# Patient Record
Sex: Male | Born: 1990 | Race: Black or African American | Hispanic: No | Marital: Single | Smoking: Current every day smoker
Health system: Southern US, Community
[De-identification: ages and names within clinical notes are randomized; demographics above are authoritative.]

---

## 2012-12-15 ENCOUNTER — Emergency Department (HOSPITAL_BASED_OUTPATIENT_CLINIC_OR_DEPARTMENT_OTHER)
Admission: EM | Admit: 2012-12-15 | Discharge: 2012-12-15 | Disposition: A | Discharge status: Home / Self Care | Payer: Self-pay | Attending: Emergency Medicine | Admitting: Emergency Medicine

## 2012-12-15 ENCOUNTER — Encounter (HOSPITAL_BASED_OUTPATIENT_CLINIC_OR_DEPARTMENT_OTHER): Payer: Self-pay | Admitting: *Deleted

## 2012-12-15 DIAGNOSIS — Y939 Activity, unspecified: Secondary | ICD-10-CM | POA: Insufficient documentation

## 2012-12-15 DIAGNOSIS — Z888 Allergy status to other drugs, medicaments and biological substances status: Secondary | ICD-10-CM | POA: Insufficient documentation

## 2012-12-15 DIAGNOSIS — IMO0002 Reserved for concepts with insufficient information to code with codable children: Secondary | ICD-10-CM | POA: Insufficient documentation

## 2012-12-15 DIAGNOSIS — S91209A Unspecified open wound of unspecified toe(s) with damage to nail, initial encounter: Secondary | ICD-10-CM

## 2012-12-15 DIAGNOSIS — F17200 Nicotine dependence, unspecified, uncomplicated: Secondary | ICD-10-CM | POA: Insufficient documentation

## 2012-12-15 DIAGNOSIS — Y990 Civilian activity done for income or pay: Secondary | ICD-10-CM | POA: Insufficient documentation

## 2012-12-15 DIAGNOSIS — Y9269 Other specified industrial and construction area as the place of occurrence of the external cause: Secondary | ICD-10-CM | POA: Insufficient documentation

## 2012-12-15 DIAGNOSIS — S91109A Unspecified open wound of unspecified toe(s) without damage to nail, initial encounter: Secondary | ICD-10-CM | POA: Insufficient documentation

## 2012-12-15 MED ORDER — IBUPROFEN 800 MG PO TABS
800.0000 mg | ORAL_TABLET | Freq: Once | ORAL | Status: AC
  Administered 2012-12-15: 800 mg via ORAL
  Filled 2012-12-15: qty 1

## 2012-12-15 NOTE — ED Notes (Signed)
MD at bedside. 

## 2012-12-15 NOTE — ED Provider Notes (Signed)
History     CSN: [id number]  Arrival date & time 12/15/12  1027   First MD Initiated Contact with Patient 12/15/12 0259      Chief Complaint  Patient presents with  . Toe Injury    (Consider location/radiation/quality/duration/timing/severity/associated sxs/prior treatment) HPI 21 year old male presents emergency department complaining of left toe injury. Patient reports he was at work when he struck his left toe. Patient has had bleeding and pain at the area of his toenails. He has no pain to his foot. He has no other injuries. He has no other complaints.  History reviewed. No pertinent past medical history.  History reviewed. No pertinent past surgical history.  History reviewed. No pertinent family history.  History  Substance Use Topics  . Smoking status: Current Every Day Smoker  . Smokeless tobacco: Not on file  . Alcohol Use: Yes      Review of Systems  All other systems reviewed and are negative.   other than listed in history of present illness  Allergies  Benadryl  Home Medications  No current outpatient prescriptions on file.  BP 129/72  Pulse 84  Temp 98.4 F (36.9 C) (Oral)  Resp 16  SpO2 99%  Physical Exam  Constitutional: He appears well-developed and well-nourished. No distress.  Musculoskeletal:       Patient with dried blood at the medial aspect at distal tip of great toe. Great toenail is in place, but is loose on the nail bed. Ecchymosis noted at base of toenail. No active bleeding. There is no deformity to the toe, no effusion no crepitus with palpation  Skin: Skin is warm and dry.    ED Course  Procedures (including critical care time)  Labs Reviewed - No data to display No results found.   1. Avulsion of toenail       MDM  21 year old male with partial avulsion of his left great toenail. Will leave the toenail in place to aid with growth of new nail. We'll place in a postop shoe for protection. Dressing will be  applied        Olivia Mackie, MD 12/15/12 [phone]

## 2012-12-15 NOTE — ED Notes (Signed)
Pt c/o pain to right great toe nail after injuring it at work

## 2013-11-24 ENCOUNTER — Encounter (HOSPITAL_BASED_OUTPATIENT_CLINIC_OR_DEPARTMENT_OTHER): Payer: Self-pay | Admitting: Emergency Medicine

## 2013-11-24 ENCOUNTER — Emergency Department (HOSPITAL_BASED_OUTPATIENT_CLINIC_OR_DEPARTMENT_OTHER)
Admission: EM | Admit: 2013-11-24 | Discharge: 2013-11-24 | Disposition: A | Discharge status: Home / Self Care | Payer: Self-pay | Attending: Emergency Medicine | Admitting: Emergency Medicine

## 2013-11-24 DIAGNOSIS — X500XXA Overexertion from strenuous movement or load, initial encounter: Secondary | ICD-10-CM | POA: Insufficient documentation

## 2013-11-24 DIAGNOSIS — S6980XA Other specified injuries of unspecified wrist, hand and finger(s), initial encounter: Secondary | ICD-10-CM | POA: Insufficient documentation

## 2013-11-24 DIAGNOSIS — S6990XA Unspecified injury of unspecified wrist, hand and finger(s), initial encounter: Secondary | ICD-10-CM | POA: Insufficient documentation

## 2013-11-24 DIAGNOSIS — Y9289 Other specified places as the place of occurrence of the external cause: Secondary | ICD-10-CM | POA: Insufficient documentation

## 2013-11-24 DIAGNOSIS — M65839 Other synovitis and tenosynovitis, unspecified forearm: Secondary | ICD-10-CM | POA: Insufficient documentation

## 2013-11-24 DIAGNOSIS — Y9389 Activity, other specified: Secondary | ICD-10-CM | POA: Insufficient documentation

## 2013-11-24 DIAGNOSIS — M779 Enthesopathy, unspecified: Secondary | ICD-10-CM

## 2013-11-24 DIAGNOSIS — F17200 Nicotine dependence, unspecified, uncomplicated: Secondary | ICD-10-CM | POA: Insufficient documentation

## 2013-11-24 DIAGNOSIS — Y990 Civilian activity done for income or pay: Secondary | ICD-10-CM | POA: Insufficient documentation

## 2013-11-24 NOTE — ED Provider Notes (Signed)
CSN: [id number]     Arrival date & time 11/24/13  2203 History  This chart was scribed for Samuel B. Bernette Mayers, MD by Ardelia Mems, ED Scribe. This patient was seen in room MH09/MH09 and the patient's care was started at 10:38 PM.   Chief Complaint  Patient presents with  . Finger Injury    The history is provided by the patient. No language interpreter was used.   HPI Comments: Samuel Stark is a 22 y.o. male who presents to the Emergency Department complaining of a right middle finger injury that occurred while lifting tools at work yesterday. He states that he lifts small tools at work repetitively, and he denies any specific injury or trauma. He is complaining of constant, moderate right middle finger pain onset after the injury. He reports associated swelling over the knuckle of his right middle finger. He denies any prior history of pain or injury to the finger. He denies fever or any other pain or symptoms.   History reviewed. No pertinent past medical history. History reviewed. No pertinent past surgical history. History reviewed. No pertinent family history.  History  Substance Use Topics  . Smoking status: Current Every Day Smoker -- 0.50 packs/day  . Smokeless tobacco: Not on file  . Alcohol Use: Yes    Review of Systems  A complete 10 system review of systems was obtained and all systems are negative except as noted in the HPI and PMH.   Allergies  Benadryl  Home Medications  No current outpatient prescriptions on file.  Triage Vitals: BP 124/81  Pulse 69  Temp(Src) 98.2 F (36.8 C) (Oral)  Resp 16  Ht 5\' 11"  (1.803 m)  Wt 163 lb (73.936 kg)  BMI 22.74 kg/m2  SpO2 100%  Physical Exam  Constitutional: He is oriented to person, place, and time. He appears well-developed and well-nourished.  HENT:  Head: Normocephalic and atraumatic.  Neck: Neck supple.  Pulmonary/Chest: Effort normal.  Musculoskeletal:  Mild swelling to the left third PIP joint. No  signs of tenosynovitis.  Neurological: He is alert and oriented to person, place, and time. No cranial nerve deficit.  Psychiatric: He has a normal mood and affect. His behavior is normal.    ED Course  Procedures (including critical care time)  DIAGNOSTIC STUDIES: Oxygen Saturation is 100% on RA, normal by my interpretation.    COORDINATION OF CARE: 10:41 PM- Pt advised of plan for treatment and pt agrees.  Labs Review Labs Reviewed - No data to display Imaging Review No results found.  EKG Interpretation   None       MDM   1. Tendonitis     Finger pain from overuse at work. No trauma. No concern for fracture. Likely a mild tendonitis. Recommend rest, ice, Hand follow up if not improving.    I personally performed the services described in this documentation, which was scribed in my presence. The recorded information has been reviewed and is accurate.      Samuel B. Bernette Mayers, MD 11/24/13 2325

## 2013-11-24 NOTE — ED Notes (Signed)
Pt c/o right middle finger injury

## 2013-11-24 NOTE — ED Notes (Signed)
C/o left hand pain onset yesterday  States has repetitive motion w that had,  Denies inj

## 2013-12-01 ENCOUNTER — Emergency Department (HOSPITAL_BASED_OUTPATIENT_CLINIC_OR_DEPARTMENT_OTHER): Payer: Self-pay

## 2013-12-01 ENCOUNTER — Emergency Department (HOSPITAL_BASED_OUTPATIENT_CLINIC_OR_DEPARTMENT_OTHER)
Admission: EM | Admit: 2013-12-01 | Discharge: 2013-12-01 | Disposition: A | Discharge status: Home / Self Care | Payer: Self-pay | Attending: Emergency Medicine | Admitting: Emergency Medicine

## 2013-12-01 ENCOUNTER — Encounter (HOSPITAL_BASED_OUTPATIENT_CLINIC_OR_DEPARTMENT_OTHER): Payer: Self-pay | Admitting: Emergency Medicine

## 2013-12-01 DIAGNOSIS — R1110 Vomiting, unspecified: Secondary | ICD-10-CM

## 2013-12-01 DIAGNOSIS — R112 Nausea with vomiting, unspecified: Secondary | ICD-10-CM | POA: Insufficient documentation

## 2013-12-01 DIAGNOSIS — R1030 Lower abdominal pain, unspecified: Secondary | ICD-10-CM | POA: Diagnosis present

## 2013-12-01 DIAGNOSIS — R109 Unspecified abdominal pain: Secondary | ICD-10-CM | POA: Insufficient documentation

## 2013-12-01 DIAGNOSIS — R197 Diarrhea, unspecified: Secondary | ICD-10-CM | POA: Insufficient documentation

## 2013-12-01 LAB — COMPREHENSIVE METABOLIC PANEL
ALT: 14 U/L (ref 0–53)
AST: 21 U/L (ref 0–37)
Albumin: 4 g/dL (ref 3.5–5.2)
CO2: 26 mEq/L (ref 19–32)
Calcium: 9.8 mg/dL (ref 8.4–10.5)
Creatinine, Ser: 1.2 mg/dL (ref 0.50–1.35)
GFR calc non Af Amer: 83 mL/min — ABNORMAL LOW (ref >90)
Sodium: 134 mEq/L — ABNORMAL LOW (ref 135–145)

## 2013-12-01 LAB — URINALYSIS W MICROSCOPIC + REFLEX CULTURE
Glucose, UA: NEGATIVE mg/dL
Hgb urine dipstick: NEGATIVE
Leukocytes, UA: NEGATIVE
Specific Gravity, Urine: 1.037 — ABNORMAL HIGH (ref 1.005–1.030)
pH: 6 (ref 5.0–8.0)

## 2013-12-01 LAB — CBC WITH DIFFERENTIAL/PLATELET
Basophils Absolute: 0 10*3/uL (ref 0.0–0.1)
Basophils Relative: 0 % (ref 0–1)
Eosinophils Relative: 0 % (ref 0–5)
Lymphocytes Relative: 13 % (ref 12–46)
MCHC: 34.8 g/dL (ref 30.0–36.0)
MCV: 82.5 fL (ref 78.0–100.0)
Monocytes Absolute: 1.5 10*3/uL — ABNORMAL HIGH (ref 0.1–1.0)
Neutro Abs: 9 10*3/uL — ABNORMAL HIGH (ref 1.7–7.7)
Platelets: 148 10*3/uL — ABNORMAL LOW (ref 150–400)
RDW: 12.9 % (ref 11.5–15.5)
WBC: 12.1 10*3/uL — ABNORMAL HIGH (ref 4.0–10.5)

## 2013-12-01 MED ORDER — SODIUM CHLORIDE 0.9 % IV BOLUS (SEPSIS)
1000.0000 mL | INTRAVENOUS | Status: AC
  Administered 2013-12-01: 1000 mL via INTRAVENOUS

## 2013-12-01 MED ORDER — ONDANSETRON 4 MG PO TBDP
ORAL_TABLET | ORAL | Status: AC
Start: 2013-12-01 — End: ?

## 2013-12-01 MED ORDER — ONDANSETRON HCL 4 MG/2ML IJ SOLN
4.0000 mg | Freq: Once | INTRAMUSCULAR | Status: AC
  Administered 2013-12-01: 4 mg via INTRAVENOUS
  Filled 2013-12-01: qty 2

## 2013-12-01 MED ORDER — IOHEXOL 300 MG/ML  SOLN
50.0000 mL | Freq: Once | INTRAMUSCULAR | Status: AC | PRN
  Administered 2013-12-01: 50 mL via ORAL

## 2013-12-01 MED ORDER — IOHEXOL 300 MG/ML  SOLN
100.0000 mL | Freq: Once | INTRAMUSCULAR | Status: AC | PRN
  Administered 2013-12-01: 100 mL via INTRAVENOUS

## 2013-12-01 MED ORDER — HYDROMORPHONE HCL PF 1 MG/ML IJ SOLN
0.5000 mg | INTRAMUSCULAR | Status: AC
Start: 2013-12-01 — End: 2013-12-01
  Administered 2013-12-01: 08:00:00 via INTRAVENOUS
  Filled 2013-12-01: qty 1

## 2013-12-01 NOTE — ED Notes (Addendum)
Patient states he has a three day history of crampy abdominal pain which is associated with nausea and vomiting up to four or five times per day.  States he took exlax because he felt like his food was sitting on his stomach.

## 2013-12-01 NOTE — ED Provider Notes (Signed)
CSN: [id number]     Arrival date & time 12/01/13  0711 History   First MD Initiated Contact with Patient 12/01/13 [phone]     Chief Complaint  Patient presents with  . Abdominal Pain   (Consider location/radiation/quality/duration/timing/severity/associated sxs/prior Treatment) Patient is a 22 y.o. male presenting with abdominal pain. The history is provided by the patient.  Abdominal Pain Pain location: lower abdomen. Pain quality: sharp   Pain radiates to:  Back Pain severity:  Moderate Onset quality:  Gradual Duration:  3 days Timing:  Constant Progression:  Worsening Chronicity:  New Context comment:  At rest Relieved by:  Nothing Worsened by:  Nothing tried Ineffective treatments:  None tried Associated symptoms: diarrhea, nausea and vomiting   Associated symptoms: no chest pain, no cough, no dysuria, no fever, no hematuria and no shortness of breath     History reviewed. No pertinent past medical history. History reviewed. No pertinent past surgical history. No family history on file. History  Substance Use Topics  . Smoking status: Current Every Day Smoker -- 0.50 packs/day    Types: Cigarettes  . Smokeless tobacco: Not on file  . Alcohol Use: Yes    Review of Systems  Constitutional: Negative for fever.  HENT: Negative for drooling and rhinorrhea.   Eyes: Negative for pain.  Respiratory: Negative for cough and shortness of breath.   Cardiovascular: Negative for chest pain and leg swelling.  Gastrointestinal: Positive for nausea, vomiting, abdominal pain and diarrhea.  Genitourinary: Negative for dysuria and hematuria.  Musculoskeletal: Negative for gait problem and neck pain.  Skin: Negative for color change.  Neurological: Negative for numbness and headaches.  Hematological: Negative for adenopathy.  Psychiatric/Behavioral: Negative for behavioral problems.  All other systems reviewed and are negative.    Allergies  Benadryl  Home Medications  No  current outpatient prescriptions on file. BP 129/84  Temp(Src) 99.6 F (37.6 C) (Oral)  Resp 20  Ht 5\' 10"  (1.778 m)  Wt 144 lb (65.318 kg)  BMI 20.66 kg/m2  SpO2 100% Physical Exam  Nursing note and vitals reviewed. Constitutional: He is oriented to person, place, and time. He appears well-developed and well-nourished.  HENT:  Head: Normocephalic and atraumatic.  Right Ear: External ear normal.  Left Ear: External ear normal.  Nose: Nose normal.  Mouth/Throat: Oropharynx is clear and moist. No oropharyngeal exudate.  Eyes: Conjunctivae and EOM are normal. Pupils are equal, round, and reactive to light.  Neck: Normal range of motion. Neck supple.  Cardiovascular: Normal rate, regular rhythm, normal heart sounds and intact distal pulses.  Exam reveals no gallop and no friction rub.   No murmur heard. Pulmonary/Chest: Effort normal and breath sounds normal. No respiratory distress. He has no wheezes.  Abdominal: Soft. Bowel sounds are normal. He exhibits no distension. There is tenderness (mild ttp of lower abdomen diffusely, mild epig pain as well). There is no rebound and no guarding.  Musculoskeletal: Normal range of motion. He exhibits no edema and no tenderness.  Neurological: He is alert and oriented to person, place, and time.  Skin: Skin is warm and dry.  Psychiatric: He has a normal mood and affect. His behavior is normal.    ED Course  Procedures (including critical care time) Labs Review Labs Reviewed  CBC WITH DIFFERENTIAL - Abnormal; Notable for the following:    WBC 12.1 (*)    Platelets 148 (*)    Neutro Abs 9.0 (*)    Monocytes Absolute 1.5 (*)    All  other components within normal limits  COMPREHENSIVE METABOLIC PANEL - Abnormal; Notable for the following:    Sodium 134 (*)    Glucose, Bld 107 (*)    GFR calc non Af Amer 83 (*)    All other components within normal limits  LIPASE, BLOOD - Abnormal; Notable for the following:    Lipase 99 (*)    All other  components within normal limits  URINALYSIS W MICROSCOPIC + REFLEX CULTURE - Abnormal; Notable for the following:    Color, Urine AMBER (*)    Specific Gravity, Urine 1.037 (*)    Bilirubin Urine SMALL (*)    Ketones, ur 15 (*)    Protein, ur 30 (*)    Urobilinogen, UA 2.0 (*)    All other components within normal limits   Imaging Review Ct Abdomen Pelvis W Contrast  12/01/2013   CLINICAL DATA:  Lower abdominal pain.  EXAM: CT ABDOMEN AND PELVIS WITH CONTRAST  TECHNIQUE: Multidetector CT imaging of the abdomen and pelvis was performed using the standard protocol following bolus administration of intravenous contrast.  CONTRAST:  50mL OMNIPAQUE IOHEXOL 300 MG/ML SOLN, OMNIPAQUE IOHEXOL 300 MG/ML SOLN  COMPARISON:  None.  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 Visualized lung bases appear normal.  The liver, spleen and pancreas appear normal. No gallstones are noted. Adrenal glands and kidneys appear normal. No hydronephrosis or renal obstruction is noted. There is no evidence of bowel obstruction. The appendix appears normal. No abnormal fluid collection is noted. Urinary bladder is decompressed. No significant adenopathy is noted.  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 No significant abnormality seen in the abdomen or pelvis.   Electronically Signed   By: Roque Lias M.D.   On: 12/01/2013 08:43    EKG Interpretation   None       MDM   1. Vomiting   2. Lower abdominal pain, unspecified laterality    7:30 AM 22 y.o. male who presents with vomiting and lower abdominal pain for the last 3 days. The patient denies any fevers, he does note that he took a laxative last night and had some diarrhea. He states that he has nonspecific lower abdominal pain worse during urination or with Valsalva and also with ambulation. He is afebrile and vital signs are unremarkable here. His abdomen is currently soft. Will get screening labwork and CT scan of abdomen. Dilaudid for pain.    9:04 AM: Pt feeling better. He continues to appear well. CT unremarkable. Mildly elev wbc and lipase, possibly viral syndrome and elev lipase assoc w/ vomiting.  I have discussed the diagnosis/risks/treatment options with the patient and family and believe the pt to be eligible for discharge home to follow-up with pcp as needed. We also discussed returning to the ED immediately if new or worsening sx occur. We discussed the sx which are most concerning (e.g., worsening pain, fever, inc vomiting, inability to tolerate po) that necessitate immediate return. Any new prescriptions provided to the patient are listed below.  New Prescriptions   ONDANSETRON (ZOFRAN ODT) 4 MG DISINTEGRATING TABLET    4mg  ODT q4 hours prn nausea/vomit       Junius Argyle, MD 12/01/13 [phone]

## 2015-04-20 IMAGING — CT CT ABD-PELV W/ CM
2 of 4 series · 16 of 46 positions shown, 18 images · IV contrast (APPLIED)
Comparison: None.

CLINICAL DATA: Lower abdominal pain.

EXAM:
CT ABDOMEN AND PELVIS WITH CONTRAST
TECHNIQUE: Multidetector CT imaging of the abdomen and pelvis was performed
using the standard protocol following bolus administration of
intravenous contrast.
CONTRAST:  50mL OMNIPAQUE IOHEXOL 300 MG/ML SOLN, 100mL OMNIPAQUE
IOHEXOL 300 MG/ML SOLN

[Series 2: abd/pelvis 5.0 b31f · axial · 0.57mm/px · z∈[-494,-54]mm · 13 of 97 slices shown, 15 images]
[im 5/97  soft-tissue]
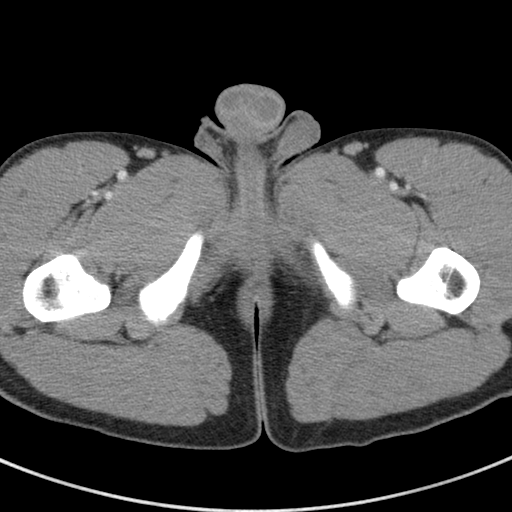
[im 5/97  bone]
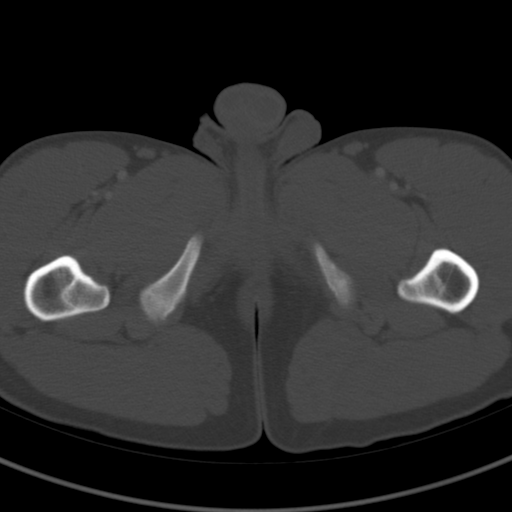
[im 13/97  soft-tissue]
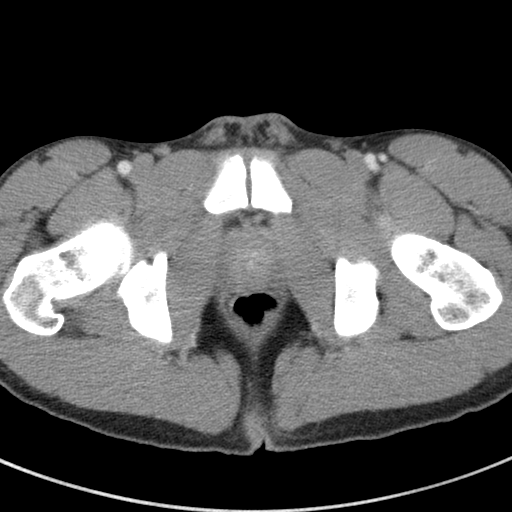
[im 21/97  soft-tissue]
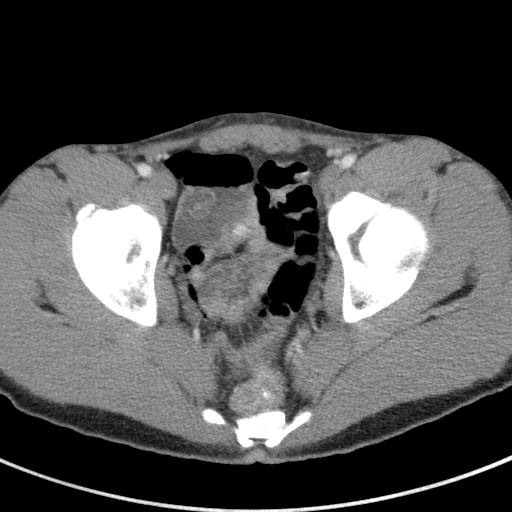
[im 29/97  soft-tissue]
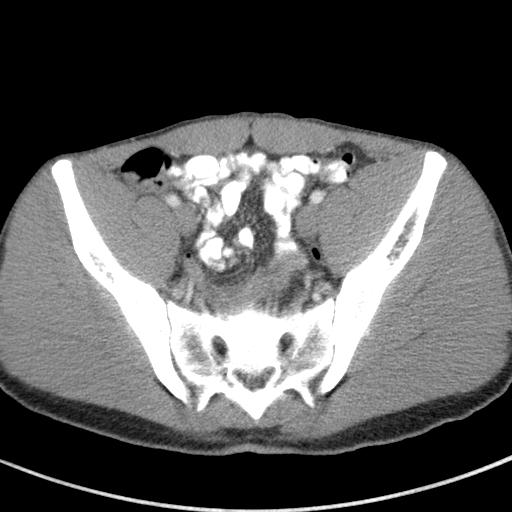
[im 33/97  soft-tissue]
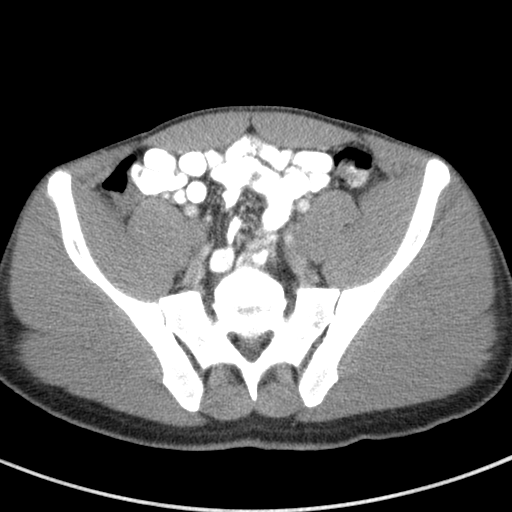
[im 41/97  soft-tissue]
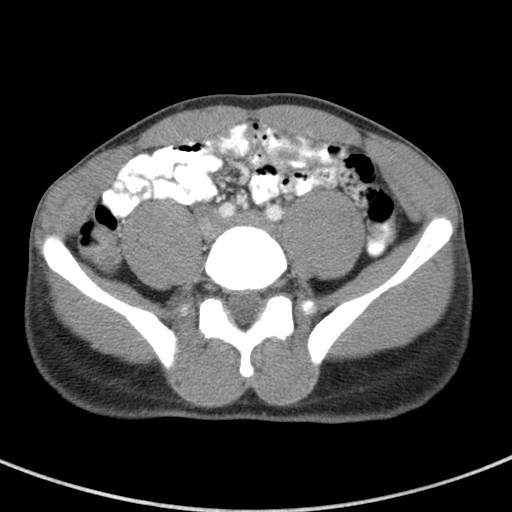
[im 49/97  soft-tissue]
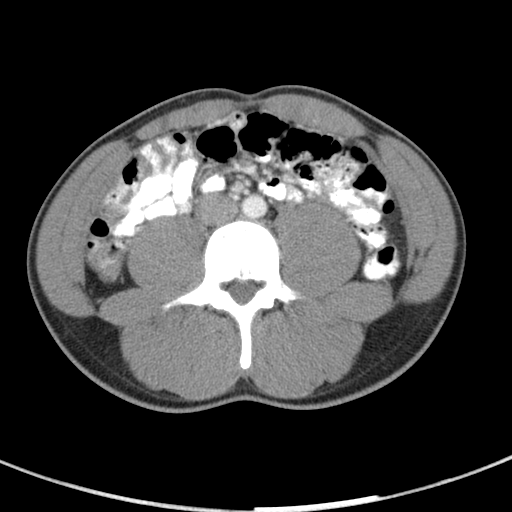
[im 57/97  soft-tissue]
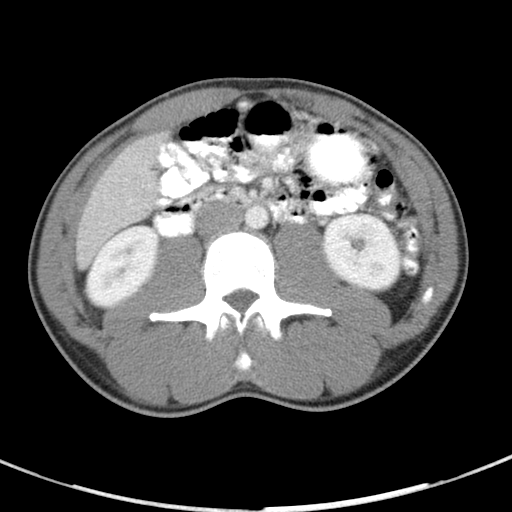
[im 65/97  soft-tissue]
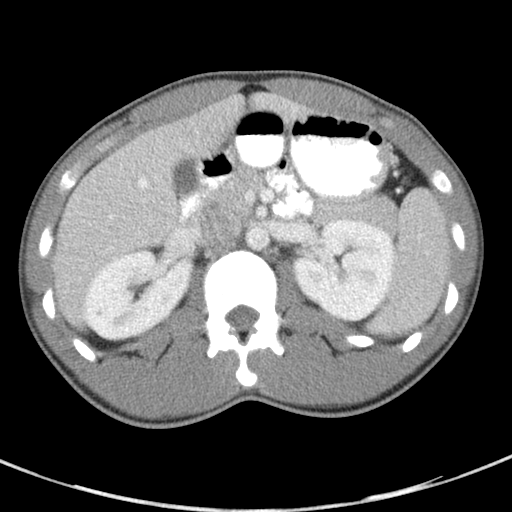
[im 65/97  bone]
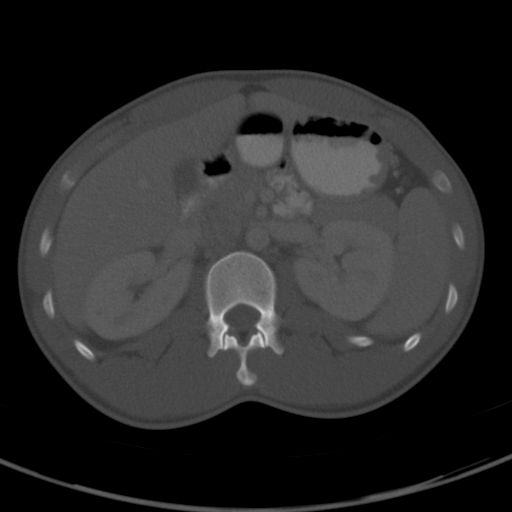
[im 69/97  soft-tissue]
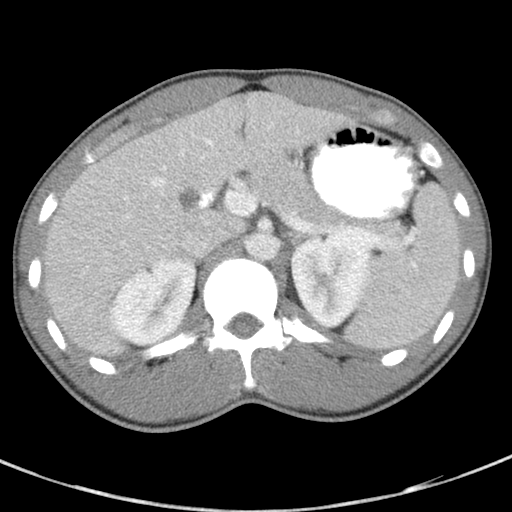
[im 77/97  soft-tissue]
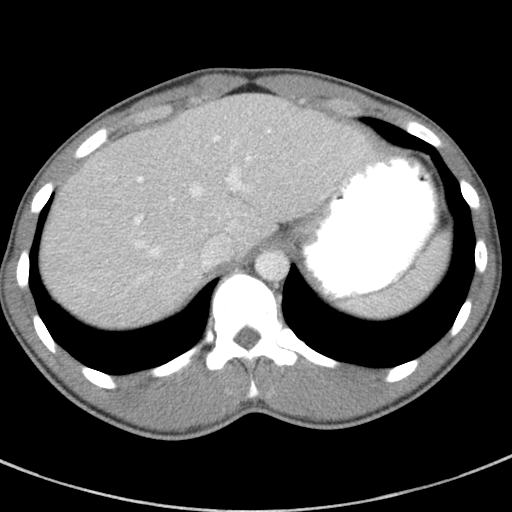
[im 85/97  soft-tissue]
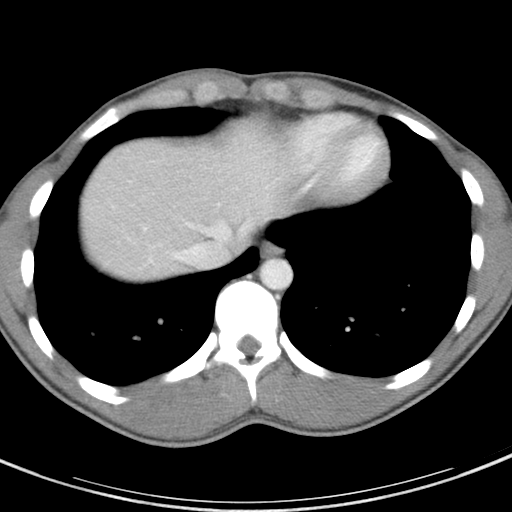
[im 93/97  soft-tissue]
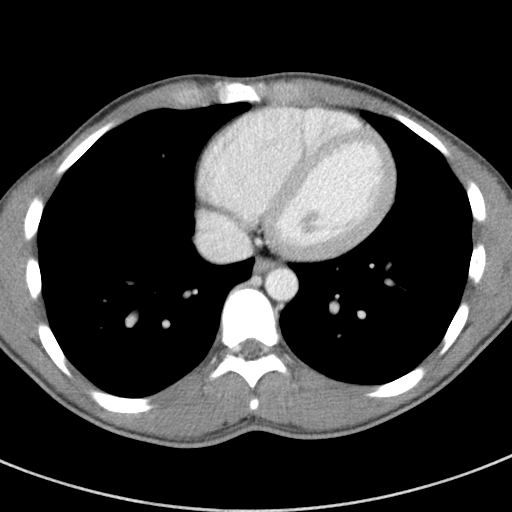

[Series 4: abd/pelvis 3.0 coronal · coronal · 0.61mm/px · 3 of 61 slices shown]
[im 21/61  soft-tissue]
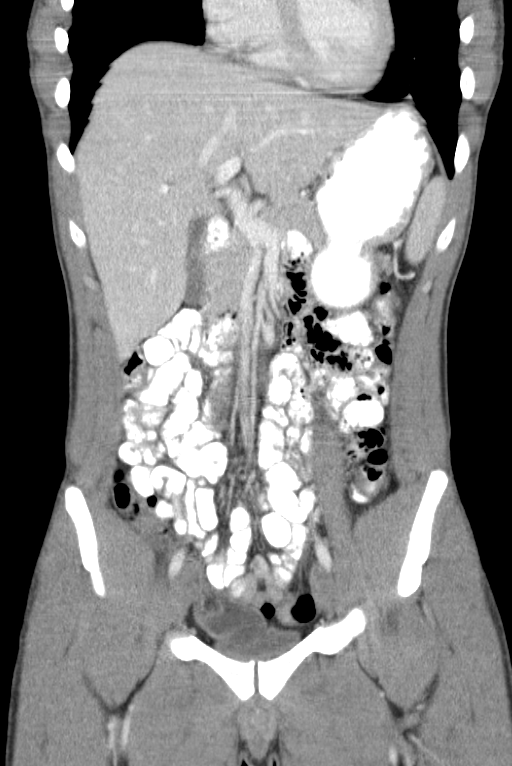
[im 27/61  soft-tissue]
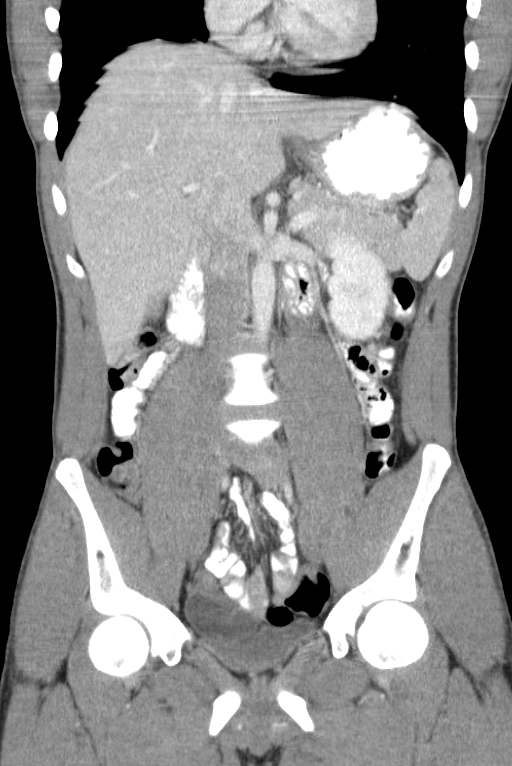
[im 34/61  soft-tissue]
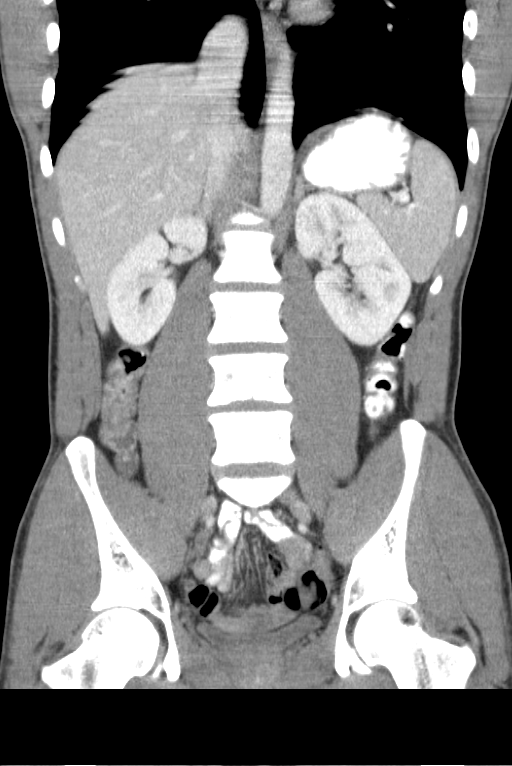

[16 of 46 positions shown; findings below may reference images not displayed]

FINDINGS: Bilateral pars defects are seen at L5. Visualized lung bases appear
normal.

The liver, spleen and pancreas appear normal. No gallstones are
noted. Adrenal glands and kidneys appear normal. No hydronephrosis
or renal obstruction is noted. There is no evidence of bowel
obstruction. The appendix appears normal. No abnormal fluid
collection is noted. Urinary bladder is decompressed. No significant
adenopathy is noted.
IMPRESSION: Bilateral pars defects are seen at L5. No significant abnormality
seen in the abdomen or pelvis.

## 2022-10-28 ENCOUNTER — Emergency Department (HOSPITAL_COMMUNITY): Payer: Worker's Compensation | Admitting: Anesthesiology

## 2022-10-28 ENCOUNTER — Other Ambulatory Visit: Payer: Self-pay

## 2022-10-28 ENCOUNTER — Emergency Department (HOSPITAL_BASED_OUTPATIENT_CLINIC_OR_DEPARTMENT_OTHER): Payer: Worker's Compensation | Admitting: Anesthesiology

## 2022-10-28 ENCOUNTER — Encounter (HOSPITAL_COMMUNITY)
Admission: EM | Disposition: A | Discharge status: Home / Self Care | Payer: Self-pay | Source: Home / Self Care | Attending: Orthopedic Surgery

## 2022-10-28 ENCOUNTER — Encounter (HOSPITAL_COMMUNITY): Payer: Self-pay | Admitting: Emergency Medicine

## 2022-10-28 ENCOUNTER — Emergency Department (HOSPITAL_COMMUNITY): Payer: Worker's Compensation

## 2022-10-28 ENCOUNTER — Observation Stay (HOSPITAL_COMMUNITY)
Admission: EM | Admit: 2022-10-28 | Discharge: 2022-10-29 | Disposition: A | Discharge status: Home / Self Care | Payer: Worker's Compensation | Attending: Orthopedic Surgery | Admitting: Orthopedic Surgery

## 2022-10-28 DIAGNOSIS — S92342B Displaced fracture of fourth metatarsal bone, left foot, initial encounter for open fracture: Secondary | ICD-10-CM

## 2022-10-28 DIAGNOSIS — S92322B Displaced fracture of second metatarsal bone, left foot, initial encounter for open fracture: Secondary | ICD-10-CM | POA: Insufficient documentation

## 2022-10-28 DIAGNOSIS — F17210 Nicotine dependence, cigarettes, uncomplicated: Secondary | ICD-10-CM | POA: Diagnosis not present

## 2022-10-28 DIAGNOSIS — X58XXXA Exposure to other specified factors, initial encounter: Secondary | ICD-10-CM | POA: Diagnosis not present

## 2022-10-28 DIAGNOSIS — S92512A Displaced fracture of proximal phalanx of left lesser toe(s), initial encounter for closed fracture: Secondary | ICD-10-CM

## 2022-10-28 DIAGNOSIS — S92522A Displaced fracture of medial phalanx of left lesser toe(s), initial encounter for closed fracture: Secondary | ICD-10-CM | POA: Insufficient documentation

## 2022-10-28 DIAGNOSIS — Z9889 Other specified postprocedural states: Secondary | ICD-10-CM

## 2022-10-28 DIAGNOSIS — S92332B Displaced fracture of third metatarsal bone, left foot, initial encounter for open fracture: Principal | ICD-10-CM | POA: Diagnosis present

## 2022-10-28 DIAGNOSIS — S92902B Unspecified fracture of left foot, initial encounter for open fracture: Principal | ICD-10-CM

## 2022-10-28 HISTORY — PX: I & D EXTREMITY: SHX5045

## 2022-10-28 LAB — CBC WITH DIFFERENTIAL/PLATELET
Abs Immature Granulocytes: 0.03 10*3/uL (ref 0.00–0.07)
Basophils Absolute: 0 10*3/uL (ref 0.0–0.1)
Basophils Relative: 0 %
Eosinophils Absolute: 0.1 10*3/uL (ref 0.0–0.5)
Eosinophils Relative: 1 %
HCT: 41.8 % (ref 39.0–52.0)
Hemoglobin: 14 g/dL (ref 13.0–17.0)
Immature Granulocytes: 0 %
Lymphocytes Relative: 19 %
Lymphs Abs: 1.5 10*3/uL (ref 0.7–4.0)
MCH: 30.8 pg (ref 26.0–34.0)
MCHC: 33.5 g/dL (ref 30.0–36.0)
MCV: 92.1 fL (ref 80.0–100.0)
Monocytes Absolute: 0.7 10*3/uL (ref 0.1–1.0)
Monocytes Relative: 9 %
Neutro Abs: 5.3 10*3/uL (ref 1.7–7.7)
Neutrophils Relative %: 71 %
Platelets: 256 10*3/uL (ref 150–400)
RBC: 4.54 MIL/uL (ref 4.22–5.81)
RDW: 13.2 % (ref 11.5–15.5)
WBC: 7.5 10*3/uL (ref 4.0–10.5)
nRBC: 0 % (ref 0.0–0.2)

## 2022-10-28 LAB — COMPREHENSIVE METABOLIC PANEL
ALT: 28 U/L (ref 0–44)
AST: 58 U/L — ABNORMAL HIGH (ref 15–41)
Albumin: 3.2 g/dL — ABNORMAL LOW (ref 3.5–5.0)
Alkaline Phosphatase: 66 U/L (ref 38–126)
Anion gap: 15 (ref 5–15)
BUN: 8 mg/dL (ref 6–20)
CO2: 22 mmol/L (ref 22–32)
Calcium: 9.1 mg/dL (ref 8.9–10.3)
Chloride: 101 mmol/L (ref 98–111)
Creatinine, Ser: 1.2 mg/dL (ref 0.61–1.24)
GFR, Estimated: >60 mL/min (ref >60)
Glucose, Bld: 107 mg/dL — ABNORMAL HIGH (ref 70–99)
Potassium: 4.7 mmol/L (ref 3.5–5.1)
Sodium: 138 mmol/L (ref 135–145)
Total Bilirubin: 0.7 mg/dL (ref 0.3–1.2)
Total Protein: 6.3 g/dL — ABNORMAL LOW (ref 6.5–8.1)

## 2022-10-28 SURGERY — IRRIGATION AND DEBRIDEMENT EXTREMITY
Anesthesia: General | Site: Foot | Laterality: Left

## 2022-10-28 MED ORDER — HYDROMORPHONE HCL 1 MG/ML IJ SOLN
2.0000 mg | Freq: Once | INTRAMUSCULAR | Status: AC
  Administered 2022-10-28: 2 mg via INTRAVENOUS
  Filled 2022-10-28: qty 2

## 2022-10-28 MED ORDER — SODIUM CHLORIDE 0.9 % IR SOLN
Status: DC | PRN
  Administered 2022-10-28: 1000 mL

## 2022-10-28 MED ORDER — ONDANSETRON HCL 4 MG/2ML IJ SOLN
4.0000 mg | Freq: Once | INTRAMUSCULAR | Status: AC
  Administered 2022-10-28: 4 mg via INTRAVENOUS
  Filled 2022-10-28: qty 2

## 2022-10-28 MED ORDER — SODIUM CHLORIDE 0.9 % IR SOLN
Status: DC | PRN
  Administered 2022-10-28: 3000 mL

## 2022-10-28 MED ORDER — CEFAZOLIN SODIUM-DEXTROSE 2-4 GM/100ML-% IV SOLN
2.0000 g | Freq: Once | INTRAVENOUS | Status: AC
  Administered 2022-10-28: 2 g via INTRAVENOUS
  Filled 2022-10-28: qty 100

## 2022-10-28 MED ORDER — MIDAZOLAM HCL 2 MG/2ML IJ SOLN
INTRAMUSCULAR | Status: AC
  Filled 2022-10-28: qty 2

## 2022-10-28 MED ORDER — FENTANYL CITRATE (PF) 250 MCG/5ML IJ SOLN
INTRAMUSCULAR | Status: AC
  Filled 2022-10-28: qty 5

## 2022-10-28 MED ORDER — BUPIVACAINE LIPOSOME 1.3 % IJ SUSP
INTRAMUSCULAR | Status: DC | PRN
  Administered 2022-10-28: 10 mL via PERINEURAL

## 2022-10-28 MED ORDER — ONDANSETRON HCL 4 MG/2ML IJ SOLN
INTRAMUSCULAR | Status: DC | PRN
  Administered 2022-10-28: 4 mg via INTRAVENOUS

## 2022-10-28 MED ORDER — MIDAZOLAM HCL 5 MG/5ML IJ SOLN
INTRAMUSCULAR | Status: DC | PRN
  Administered 2022-10-28: 2 mg via INTRAVENOUS

## 2022-10-28 MED ORDER — PROPOFOL 10 MG/ML IV BOLUS
INTRAVENOUS | Status: AC
  Filled 2022-10-28: qty 20

## 2022-10-28 MED ORDER — TETANUS-DIPHTH-ACELL PERTUSSIS 5-2.5-18.5 LF-MCG/0.5 IM SUSY
0.5000 mL | PREFILLED_SYRINGE | Freq: Once | INTRAMUSCULAR | Status: DC
  Filled 2022-10-28: qty 0.5

## 2022-10-28 MED ORDER — OXYCODONE HCL 5 MG PO TABS: 5.0000 mg | ORAL_TABLET | Freq: Four times a day (QID) | ORAL | 0 refills | Status: AC | PRN

## 2022-10-28 MED ORDER — BUPIVACAINE HCL (PF) 0.5 % IJ SOLN
INTRAMUSCULAR | Status: DC | PRN
  Administered 2022-10-28: 15 mL via PERINEURAL
  Administered 2022-10-28: 10 mL via PERINEURAL

## 2022-10-28 MED ORDER — PROPOFOL 10 MG/ML IV BOLUS
INTRAVENOUS | Status: DC | PRN
  Administered 2022-10-28: 180 mg via INTRAVENOUS

## 2022-10-28 MED ORDER — HYDROMORPHONE HCL 1 MG/ML IJ SOLN
1.0000 mg | Freq: Once | INTRAMUSCULAR | Status: AC
  Administered 2022-10-28: 1 mg via INTRAVENOUS
  Filled 2022-10-28: qty 1

## 2022-10-28 MED ORDER — LACTATED RINGERS IV SOLN: INTRAVENOUS | Status: DC | PRN

## 2022-10-28 MED ORDER — CEFAZOLIN SODIUM-DEXTROSE 2-3 GM-%(50ML) IV SOLR
INTRAVENOUS | Status: DC | PRN
  Administered 2022-10-28: 2 g via INTRAVENOUS

## 2022-10-28 MED ORDER — FENTANYL CITRATE (PF) 100 MCG/2ML IJ SOLN: 25.0000 ug | INTRAMUSCULAR | Status: DC | PRN

## 2022-10-28 MED ORDER — FENTANYL CITRATE (PF) 250 MCG/5ML IJ SOLN
INTRAMUSCULAR | Status: DC | PRN
  Administered 2022-10-28: 50 ug via INTRAVENOUS

## 2022-10-28 MED ORDER — AMISULPRIDE (ANTIEMETIC) 5 MG/2ML IV SOLN: 10.0000 mg | Freq: Once | INTRAVENOUS | Status: DC | PRN

## 2022-10-28 MED ORDER — CEFAZOLIN SODIUM 1 G IJ SOLR
INTRAMUSCULAR | Status: AC
  Filled 2022-10-28: qty 20

## 2022-10-28 MED ORDER — PROMETHAZINE HCL 25 MG/ML IJ SOLN: 6.2500 mg | INTRAMUSCULAR | Status: DC | PRN

## 2022-10-28 SURGICAL SUPPLY — 68 items
ALCOHOL 70% 16 OZ (MISCELLANEOUS) ×1 IMPLANT
BAG COUNTER SPONGE SURGICOUNT (BAG) ×1 IMPLANT
BLADE SURG 10 STRL SS (BLADE) ×1 IMPLANT
BNDG COHESIVE 1X5 TAN STRL LF (GAUZE/BANDAGES/DRESSINGS) IMPLANT
BNDG COHESIVE 4X5 TAN STRL (GAUZE/BANDAGES/DRESSINGS) ×1 IMPLANT
BNDG COHESIVE 4X5 TAN STRL LF (GAUZE/BANDAGES/DRESSINGS) IMPLANT
BNDG COHESIVE 6X5 TAN STRL LF (GAUZE/BANDAGES/DRESSINGS) ×2 IMPLANT
BNDG CONFORM 3 STRL LF (GAUZE/BANDAGES/DRESSINGS) IMPLANT
BNDG ELASTIC 3X5.8 VLCR STR LF (GAUZE/BANDAGES/DRESSINGS) IMPLANT
BNDG GAUZE DERMACEA FLUFF 4 (GAUZE/BANDAGES/DRESSINGS) ×3 IMPLANT
CORD BIPOLAR FORCEPS 12FT (ELECTRODE) IMPLANT
COVER SURGICAL LIGHT HANDLE (MISCELLANEOUS) ×1 IMPLANT
CUFF TOURN SGL QUICK 24 (TOURNIQUET CUFF)
CUFF TOURN SGL QUICK 34 (TOURNIQUET CUFF) ×2
CUFF TOURN SGL QUICK 42 (TOURNIQUET CUFF) IMPLANT
CUFF TRNQT CYL 24X4X16.5-23 (TOURNIQUET CUFF) IMPLANT
CUFF TRNQT CYL 34X4.125X (TOURNIQUET CUFF) ×2 IMPLANT
DRAPE EXTREMITY BILATERAL (DRAPES) IMPLANT
DRAPE IMP U-DRAPE 54X76 (DRAPES) IMPLANT
DRAPE INCISE IOBAN 66X45 STRL (DRAPES) ×4 IMPLANT
DRAPE SURG 17X23 STRL (DRAPES) IMPLANT
DRAPE U-SHAPE 47X51 STRL (DRAPES) ×1 IMPLANT
DRSG ADAPTIC 3X8 NADH LF (GAUZE/BANDAGES/DRESSINGS) IMPLANT
DURAPREP 26ML APPLICATOR (WOUND CARE) ×1 IMPLANT
ELECT CAUTERY BLADE 6.4 (BLADE) ×1 IMPLANT
ELECT REM PT RETURN 9FT ADLT (ELECTROSURGICAL)
ELECTRODE REM PT RTRN 9FT ADLT (ELECTROSURGICAL) IMPLANT
FACESHIELD WRAPAROUND (MASK) IMPLANT
FACESHIELD WRAPAROUND OR TEAM (MASK) IMPLANT
GAUZE PAD ABD 8X10 STRL (GAUZE/BANDAGES/DRESSINGS) ×1 IMPLANT
GAUZE SPONGE 4X4 12PLY STRL (GAUZE/BANDAGES/DRESSINGS) ×2 IMPLANT
GAUZE XEROFORM 1X8 LF (GAUZE/BANDAGES/DRESSINGS) ×1 IMPLANT
GAUZE XEROFORM 5X9 LF (GAUZE/BANDAGES/DRESSINGS) ×1 IMPLANT
GLOVE BIO SURGEON STRL SZ7.5 (GLOVE) ×2 IMPLANT
GLOVE BIOGEL PI IND STRL 8 (GLOVE) ×2 IMPLANT
GOWN STRL REUS W/ TWL LRG LVL3 (GOWN DISPOSABLE) ×2 IMPLANT
GOWN STRL REUS W/ TWL XL LVL3 (GOWN DISPOSABLE) ×2 IMPLANT
GOWN STRL REUS W/TWL LRG LVL3 (GOWN DISPOSABLE) ×3
GOWN STRL REUS W/TWL XL LVL3 (GOWN DISPOSABLE) ×1
HANDPIECE INTERPULSE COAX TIP (DISPOSABLE)
KIT BASIN OR (CUSTOM PROCEDURE TRAY) ×1 IMPLANT
KIT TURNOVER KIT B (KITS) ×1 IMPLANT
MANIFOLD NEPTUNE II (INSTRUMENTS) ×1 IMPLANT
NS IRRIG 1000ML POUR BTL (IV SOLUTION) ×2 IMPLANT
PACK ORTHO EXTREMITY (CUSTOM PROCEDURE TRAY) ×1 IMPLANT
PAD ABD 8X10 STRL (GAUZE/BANDAGES/DRESSINGS) IMPLANT
PAD ARMBOARD 7.5X6 YLW CONV (MISCELLANEOUS) ×2 IMPLANT
PADDING CAST ABS COTTON 4X4 ST (CAST SUPPLIES) ×2 IMPLANT
PADDING CAST COTTON 6X4 STRL (CAST SUPPLIES) ×1 IMPLANT
SET CYSTO W/LG BORE CLAMP LF (SET/KITS/TRAYS/PACK) ×1 IMPLANT
SET HNDPC FAN SPRY TIP SCT (DISPOSABLE) IMPLANT
SPONGE T-LAP 18X18 ~~LOC~~+RFID (SPONGE) ×2 IMPLANT
STOCKINETTE IMPERVIOUS 9X36 MD (GAUZE/BANDAGES/DRESSINGS) ×1 IMPLANT
SUT ETHILON 2 0 FS 18 (SUTURE) IMPLANT
SUT ETHILON 2 0 PSLX (SUTURE) IMPLANT
SUT ETHILON 3 0 FSL (SUTURE) IMPLANT
SUT ETHILON 3 0 PS 1 (SUTURE) IMPLANT
SUT VIC AB 2-0 CT1 36 (SUTURE) IMPLANT
SUT VIC AB 2-0 FS1 27 (SUTURE) IMPLANT
SWAB CULTURE ESWAB REG 1ML (MISCELLANEOUS) IMPLANT
SYR CONTROL 10ML LL (SYRINGE) IMPLANT
TOWEL GREEN STERILE (TOWEL DISPOSABLE) ×1 IMPLANT
TOWEL GREEN STERILE FF (TOWEL DISPOSABLE) ×1 IMPLANT
TUBE CONNECTING 12X1/4 (SUCTIONS) ×1 IMPLANT
TUBE FEEDING ENTERAL 5FR 16IN (TUBING) IMPLANT
UNDERPAD 30X36 HEAVY ABSORB (UNDERPADS AND DIAPERS) ×2 IMPLANT
WATER STERILE IRR 1000ML POUR (IV SOLUTION) ×1 IMPLANT
YANKAUER SUCT BULB TIP NO VENT (SUCTIONS) ×1 IMPLANT

## 2022-10-28 NOTE — Transfer of Care (Signed)
Immediate Anesthesia Transfer of Care Note  Patient: Samuel Stark  Procedure(s) Performed: IRRIGATION AND DEBRIDEMENT LEFT FOOT AND WOUND CLOSURE (Left: Foot)  Patient Location: PACU  Anesthesia Type:GA combined with regional for post-op pain  Level of Consciousness: drowsy  Airway & Oxygen Therapy: Patient Spontanous Breathing  Post-op Assessment: Report given to RN and Post -op Vital signs reviewed and stable  Post vital signs: Reviewed and stable  Last Vitals:  Vitals Value Taken Time  BP 121/86 10/28/22 2324  Temp    Pulse 84 10/28/22 2327  Resp 20 10/28/22 2327  SpO2 98 % 10/28/22 2327  Vitals shown include unvalidated device data.  Last Pain:  Vitals:   10/28/22 2125  TempSrc:   PainSc: 10-Worst pain ever         Complications: No notable events documented.

## 2022-10-28 NOTE — Op Note (Signed)
Date of Surgery: 10/28/2022  INDICATIONS: Mr. Crisafulli is a 31 y.o.-year-old male with a left foot blunt trauma while at work prior to arrival.  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  This resulted in an open trauma and multiple fractures to the forefoot;  The patient did consent to the procedure after discussion of the risks and benefits.  PREOPERATIVE DIAGNOSIS:  1.  Left second metatarsal shaft fracture, grade 2 open 2.  Left third metatarsal shaft fracture, grade 2 open 3.  Left fourth metatarsal shaft segmental fracture, grade 2 open 4 left foot second toe middle phalanx fracture, closed   POSTOPERATIVE DIAGNOSIS: Same.  PROCEDURE:  Incisional and excisional irrigation and debridement for open fracture of left foot second through fourth metatarsal fractures, grade 2 open 2.  Complex closure of wound measuring approximately 10 cm in linear length.  SURGEON: Geralynn Rile, M.D.  ASSIST: none.  ANESTHESIA:  general, regional  IV FLUIDS AND URINE: See anesthesia.  ESTIMATED BLOOD LOSS: 50 mL.  IMPLANTS: none  DRAINS: none  COMPLICATIONS: None.  DESCRIPTION OF PROCEDURE: The patient was brought to the operating room and placed supine on the operating table.  The patient had been signed prior to the procedure and this was documented. The patient had the anesthesia placed by the anesthesiologist.  A time-out was performed to confirm that this was the correct patient, site, side and location. The patient did receive antibiotics prior to the incision and was re-dosed during the procedure as needed at indicated intervals.  A tourniquet was not placed.  The patient had the operative extremity prepped and draped in the standard surgical fashion.     We began the procedure by addressing the fourth metatarsal shaft fracture.  This was punctate open at the dorsal aspect of the fourth metatarsal ray.  We incised just distal to this to expose the fracture ends.  We then used a  knife, rondure and electrocautery to excise skin, subcutaneous tissue, and bone at the fracture site.  We then reduced the fourth metatarsal back into an anatomic position and this allowed easy closure of the skin overlying that.  We then after irrigating, with copious amounts of normal saline, closed primarily with 3-0 nylon there.  We then turned our attention to the primary traumatic region.  Overlying the plantar aspect of the MTP joints from the hallux all the way over to the fourth toe there was a laceration that involved the interspace of the first and second toe as well as second third and third and fourth toes.  This then continued along a transverse fashion along the MTP region.  We then copiously irrigated this with normal saline.  We then sharply excised nonviable tissue including skin, subcutaneous tissue, and bone and muscle at this level with knife and rondure.  Next, we moved to primarily close this complex wound in a single layer with interrupted 3-0 nylon sutures.  Total linear length was about 10 cm.  The foot was then cleaned and dried.  Standard sterile bandages were applied with an Adaptic for the incisions and soft dressing between the interspaces of the toes and then over wrapped in Kerlix and Coban.  He will be placed in a fracture shoe tomorrow.  There were no noted intraoperative complications and no anesthetic complications.  He was awakened from general anesthesia and transported to PACU in stable condition.  POSTOPERATIVE PLAN:  Mr. Betts will be weightbearing as tolerated through the heel immediately.  He can begin forefoot weightbearing in approximately 3 to 4 weeks.  I am going to admit him to continue IV antibiotics for 2 more doses.  He can then discharge home tomorrow after lunch.  Maintain bandages for 2 weeks if able if not he can remove and replace with dry dressings if they become saturated or soiled.

## 2022-10-28 NOTE — Brief Op Note (Signed)
10/28/2022  11:07 PM  PATIENT:  Blenda Peals  31 y.o. male  PRE-OPERATIVE DIAGNOSIS:  LEFT FOOT OPEN FRACTURES  POST-OPERATIVE DIAGNOSIS:  LEFT FOOT OPEN FRACTURES  PROCEDURE:  Procedure(s): IRRIGATION AND DEBRIDEMENT LEFT FOOT AND WOUND CLOSURE (Left)  SURGEON:  Surgeon(s) and Role:    * Nicholes Stairs, MD - Primary  PHYSICIAN ASSISTANT: none  ASSISTANTS: none   ANESTHESIA:   regional and general  EBL:  50 mL   BLOOD ADMINISTERED:none  DRAINS: none   LOCAL MEDICATIONS USED:  NONE  SPECIMEN:  No Specimen  DISPOSITION OF SPECIMEN:  N/A  COUNTS:  YES  TOURNIQUET:  * Missing tourniquet times found for documented tourniquets in log: [id number] *  DICTATION: .Note written in EPIC  PLAN OF CARE: Admit for overnight observation  PATIENT DISPOSITION:  PACU - hemodynamically stable.   Delay start of Pharmacological VTE agent (>24hrs) due to surgical blood loss or risk of bleeding: not applicable

## 2022-10-28 NOTE — ED Provider Notes (Signed)
[facility] EMERGENCY DEPARTMENT Provider Note   CSN: [id number] Arrival date & time: 10/28/22  1927     History  Chief Complaint  Patient presents with   Foot Injury    Samuel Stark is a 31 year old male with no past medical history who presents to the emergency department for foot injury.  The patient states that someone operating a foot lift accidentally ran over his left foot with the forklift.  He did not sustain any other injuries or hit his head.  He has not take any medication including blood thinners, or have any allergies.  He was not given any pain medications with EMS prior to arrival.  The history is provided by the patient and the EMS personnel.  Foot Injury      Home Medications Prior to Admission medications   Medication Sig Start Date End Date Taking? Authorizing Provider  ondansetron (ZOFRAN ODT) 4 MG disintegrating tablet 4mg  ODT q4 hours prn nausea/vomit Patient not taking: Reported on 10/28/2022 12/01/13   14/3/14, MD      Allergies    Benadryl [diphenhydramine hcl]    Review of Systems    Review of Systems See HPI.   Physical Exam Updated Vital Signs BP (!) 130/102   Pulse 75   Temp 98.2 F (36.8 C) (Oral)   Resp 13   Ht 5\' 11"  (1.803 m)   Wt 70.8 kg   SpO2 98%   BMI 21.76 kg/m   Physical Exam Vitals and nursing note reviewed.  Constitutional:      Appearance: He is normal weight. He is not toxic-appearing.  HENT:     Head: Normocephalic and atraumatic.  Eyes:     Extraocular Movements: Extraocular movements intact.     Conjunctiva/sclera: Conjunctivae normal.  Cardiovascular:     Rate and Rhythm: Normal rate and regular rhythm.  Pulmonary:     Effort: Pulmonary effort is normal. No respiratory distress.  Abdominal:     General: There is no distension.     Palpations: Abdomen is soft.     Tenderness: There is no abdominal tenderness.  Musculoskeletal:        General: Deformity and signs of  injury present.     Cervical back: Normal range of motion.     Comments: Open fracture of left foot.  Bone visible from open wound to dorsum of foot.  Open laceration to plantar aspect of foot about first and second digits, hemostatic.  Sensation intact in all 5 toes.  Limited range of motion of right toe.  2+ palpable DP, PT pulses.  Capillary refill in toes intact.  Skin:    General: Skin is warm and dry.  Neurological:     General: No focal deficit present.     Mental Status: He is alert and oriented to person, place, and time.          ED Results / Procedures / Treatments   Labs (all labs ordered are listed, but only abnormal results are displayed) Labs Reviewed  COMPREHENSIVE METABOLIC PANEL - Abnormal; Notable for the following components:      Result Value   Glucose, Bld 107 (*)    Total Protein 6.3 (*)    Albumin 3.2 (*)    AST 58 (*)    All other components within normal limits  CBC WITH DIFFERENTIAL/PLATELET    EKG None  Radiology DG Foot 2 Views Left  Result Date: 10/28/2022 CLINICAL DATA:  Question injury to the  left foot. EXAM: LEFT FOOT - 2 VIEW COMPARISON:  None Available. FINDINGS: Comminuted and displaced fracture of the second and third distal metatarsals. There is a mildly displaced and angulated fracture of the proximal fourth metatarsal. Mildly comminuted fracture of the base of the fifth metatarsal. There is no convincing intra-articular involvement of these fractures by radiograph. Suspected fracture of the second toe middle phalanx. No visible tarsal bone fracture. Soft tissue air and edema overlies the dorsum of the foot. A dressing is in place. IMPRESSION: 1. Comminuted and displaced fractures of the second and third distal metatarsals. 2. Mildly displaced and angulated fracture of the proximal fourth metatarsal. 3. Mildly comminuted and displaced fracture of the base of the fifth metatarsal. 4. Suspect fracture of the second toe middle phalanx.  Electronically Signed   By: Keith Rake M.D.   On: 10/28/2022 20:47    Procedures Procedures    Medications Ordered in ED Medications  Tdap (BOOSTRIX) injection 0.5 mL (0.5 mLs Intramuscular Not Given 10/28/22 1945)  HYDROmorphone (DILAUDID) injection 1 mg (1 mg Intravenous Given 10/28/22 1954)  ceFAZolin (ANCEF) IVPB 2g/100 mL premix (0 g Intravenous Stopped 10/28/22 2126)  HYDROmorphone (DILAUDID) injection 2 mg (2 mg Intravenous Given 10/28/22 2125)  ondansetron (ZOFRAN) injection 4 mg (4 mg Intravenous Given 10/28/22 2124)    ED Course/ Medical Decision Making/ A&P Clinical Course as of 10/28/22 2156  Mon Oct 28, 2022  2051 IMPRESSION: 1. Comminuted and displaced fractures of the second and third distal metatarsals. 2. Mildly displaced and angulated fracture of the proximal fourth metatarsal. 3. Mildly comminuted and displaced fracture of the base of the fifth metatarsal. 4. Suspect fracture of the second toe middle phalanx. [DG]    Clinical Course User Index [DG] Renard Matter, MD                           Medical Decision Making Problems Addressed: Open fracture of left foot, initial encounter: acute illness or injury that poses a threat to life or bodily functions  Amount and/or Complexity of Data Reviewed Independent Historian: EMS Labs: ordered. Decision-making details documented in ED Course. Radiology: ordered and independent interpretation performed. Decision-making details documented in ED Course. Discussion of management or test interpretation with external provider(s): Orthopedics  Risk Prescription drug management. Decision regarding hospitalization.   Samuel Stark presents with left foot pain after forklift crush injury as per above.  I have reviewed the nursing documentation for past medical history, family history, and social history.   The patient is HDS on arrival.   Differential Diagnosis:  Differential diagnosis includes  fracture, dislocation.  Concerns for possible nerve injury as patient has difficulty moving his big toe.  The wound is hemostatic at this time, no acute arterial bleeding.  No evidence of compartment syndrome including no paresthesias, pallor, diminished pulses, paralysis, or severe swelling.    Testing:  CBC unremarkable, no leukocytosis to suggest infection, hemoglobin within normal limits at 14. CMP with AST 58, otherwise unremarkable. X-ray imaging demonstrated displaced open fractures of the proximal second and third mid-shaft metatarsals as well as proximal fourth metatarsal, on my independent review.   Xray Read IMPRESSION:  1. Comminuted and displaced fractures of the second and third distal  metatarsals.  2. Mildly displaced and angulated fracture of the proximal fourth  metatarsal.  3. Mildly comminuted and displaced fracture of the base of the fifth  metatarsal.  4. Suspect fracture of the second toe middle phalanx.  Interventions:  While in the ED, I provided the patient with Ancef 2 g for concerns for open fracture, Dilaudid, and Tdap booster.  ED Course:  On reassessment, the patient continues to have pain in the left foot and was given additional pain medication.  He remains hemodynamically stable.  This presentation is most consistent with open fractures of left foot.   Orthopedics consulted, took patient to OR for operative management.  I saw this patient in conjunction with my attending, Dr. Silverio Lay, who provided oversight and voiced agreement with this assessment and plan.          Final Clinical Impression(s) / ED Diagnoses Final diagnoses:  Open fracture of left foot, initial encounter    Rx / DC Orders ED Discharge Orders     None         Stephanie Coup, MD 10/28/22 2156    Charlynne Pander, MD 10/28/22 2238

## 2022-10-28 NOTE — H&P (Signed)
ORTHOPAEDIC H&P  REQUESTING PHYSICIAN: Samuel Freeze, MD  PCP:  Samuel Stark, No  Chief Complaint: Left foot trauma  HPI: Samuel Stark is a 31 y.o. male who complains of left foot trauma sustained at work about 4 hours ago.  He states around 530 at work his coworker backed over his left foot with a forklift.  He then had to drive forward back over the foot so that he could get out from under the lift.  He had immediate pain and swelling.  He noted some bleeding there.  He came to the ER where he was noted to have multiple metatarsal fractures as well as some phalangeal fractures that are associated with open wounds about the foot.  Complaining of moderate pain at this time.  He denies history of any trauma or surgical process to the left foot or ankle.  He does smoke about a third of a pack of cigarettes per day.  He denies diabetes or other medical comorbidity.  History reviewed. No pertinent past medical history. History reviewed. No pertinent surgical history. Social History   Socioeconomic History   Marital status: Single    Spouse name: Not on file   Number of children: Not on file   Years of education: Not on file   Highest education level: Not on file  Occupational History   Not on file  Tobacco Use   Smoking status: Every Day    Packs/day: 0.50    Types: Cigarettes   Smokeless tobacco: Not on file  Substance and Sexual Activity   Alcohol use: Yes   Drug use: No   Sexual activity: Not on file  Other Topics Concern   Not on file  Social History Narrative   Not on file   Social Determinants of Health   Financial Resource Strain: Not on file  Food Insecurity: Not on file  Transportation Needs: Not on file  Physical Activity: Not on file  Stress: Not on file  Social Connections: Not on file   History reviewed. No pertinent family history. Allergies  Allergen Reactions   Benadryl [Diphenhydramine Hcl] Hives   Prior to Admission medications   Medication Sig  Start Date End Date Taking? Authorizing Provider  ondansetron (ZOFRAN ODT) 4 MG disintegrating tablet 4mg  ODT q4 hours prn nausea/vomit Patient not taking: Reported on 10/28/2022 12/01/13   Samuel Pert, MD   DG Foot 2 Views Left  Result Date: 10/28/2022 CLINICAL DATA:  Question injury to the left foot. EXAM: LEFT FOOT - 2 VIEW COMPARISON:  None Available. FINDINGS: Comminuted and displaced fracture of the second and third distal metatarsals. There is a mildly displaced and angulated fracture of the proximal fourth metatarsal. Mildly comminuted fracture of the base of the fifth metatarsal. There is no convincing intra-articular involvement of these fractures by radiograph. Suspected fracture of the second toe middle phalanx. No visible tarsal bone fracture. Soft tissue air and edema overlies the dorsum of the foot. A dressing is in place. IMPRESSION: 1. Comminuted and displaced fractures of the second and third distal metatarsals. 2. Mildly displaced and angulated fracture of the proximal fourth metatarsal. 3. Mildly comminuted and displaced fracture of the base of the fifth metatarsal. 4. Suspect fracture of the second toe middle phalanx. Electronically Signed   By: Samuel Stark M.D.   On: 10/28/2022 20:47    Positive ROS: All other systems have been reviewed and were otherwise negative with the exception of those mentioned in the HPI and as above.  Physical  Exam: General: Alert, no acute distress Cardiovascular: No pedal edema Respiratory: No cyanosis, no use of accessory musculature GI: No organomegaly, abdomen is soft and non-tender Skin: No lesions in the area of chief complaint Neurologic: Sensation intact distally Psychiatric: Patient is competent for consent with normal mood and affect Lymphatic: No axillary or cervical lymphadenopathy  MUSCULOSKELETAL: Left foot is wrapped in a bandage and is blood soaked.  He is able to wiggle the toes and capillary refills less than 2 seconds.   No signs of trauma up the leg around the knee.  Assessment: 1.  Left second metatarsal shaft fracture, grade 2 open 2.  Left third metatarsal shaft fracture, grade 2 open 3.  Left fourth metatarsal shaft segmental fracture, grade 2 open 4 left foot second toe middle phalanx fracture, closed  Plan: -Mr. Samuel Stark I discussed that there is some urgency to his injury given that this is an open injury and it was in an industrial setting.  He has had tetanus and IV antibiotics in the ER.  -Plan to proceed with irrigation and debridement in urgent fashion.  Fractures were somewhat comminuted and seem to be length stable.  I will plan to treat these closed at this time and will place him in a postop shoe for support and allow some heel weightbearing immediately.  However, I will also review this case with my foot and ankle partners to see if they feel strongly about internal fixation.  At this time, our main goal is to debride and close the wounds to reduce chance of osteomyelitis or an infection associated with the injury.  -We will admit postoperatively for 24 hours of IV antibiotics.  Can discharge home tomorrow afternoon once he has had his third dose.  - The risks, benefits, and alternatives were discussed with the patient. There are risks associated with the surgery including, but not limited to, problems with anesthesia (death), infection, differences in leg length/angulation/rotation, fracture of bones, loosening or failure of implants, malunion, nonunion, hematoma (blood accumulation) which may require surgical drainage, blood clots, pulmonary embolism, nerve injury (foot drop), and blood vessel injury. The patient understands these risks and elects to proceed.     Samuel Kida, MD Cell [phone]    10/28/2022 10:08 PM

## 2022-10-28 NOTE — Anesthesia Procedure Notes (Addendum)
Anesthesia Regional Block: Popliteal block   Pre-Anesthetic Checklist: , timeout performed,  Correct Patient, Correct Site, Correct Laterality,  Correct Procedure, Correct Position, site marked,  Risks and benefits discussed,  Surgical consent,  Pre-op evaluation,  At surgeon's request and post-op pain management  Laterality: Left  Prep: chloraprep       Needles:  Injection technique: Single-shot  Needle Type: Echogenic Stimulator Needle          Additional Needles:   Procedures:,,,, ultrasound used (permanent image in chart),,    Narrative:  Start time: 10/28/2022 10:10 PM End time: 10/28/2022 10:20 PM Injection made incrementally with aspirations every 5 mL.  Performed by: Personally  Anesthesiologist: Duane Boston, MD  Additional Notes: A functioning IV was confirmed and monitors were applied.  Sterile prep and drape, hand hygiene and sterile gloves were used.  Negative aspiration and test dose prior to incremental administration of local anesthetic. The patient tolerated the procedure well.Ultrasound  guidance: relevant anatomy identified, needle position confirmed, local anesthetic spread visualized around nerve(s), vascular puncture avoided.  Image printed for medical record. ABC supplement.

## 2022-10-28 NOTE — ED Triage Notes (Signed)
Pt BIB after foot being run over by a forklift. Obvious deformity to top of foot, 1st, 2nd, and 4th digits. Bleeding controlled. Able to move foot but not toes.    BP 126/84 HR 90 RR 18 99% CBG 185

## 2022-10-28 NOTE — Anesthesia Preprocedure Evaluation (Addendum)
Anesthesia Evaluation  Patient identified by MRN, date of birth, ID band Patient awake    Reviewed: Allergy & Precautions, NPO status , Patient's Chart, lab work & pertinent test results  History of Anesthesia Complications (+) history of anesthetic complications  Airway Mallampati: II  TM Distance: >3 FB Neck ROM: Full    Dental no notable dental hx. (+) Dental Advisory Given   Pulmonary Current Smoker,    Pulmonary exam normal        Cardiovascular negative cardio ROS Normal cardiovascular exam     Neuro/Psych negative neurological ROS     GI/Hepatic negative GI ROS, Neg liver ROS,   Endo/Other  negative endocrine ROS  Renal/GU negative Renal ROS     Musculoskeletal   Abdominal   Peds  Hematology negative hematology ROS (+)   Anesthesia Other Findings   Reproductive/Obstetrics                            Anesthesia Physical Anesthesia Plan  ASA: 2  Anesthesia Plan: General   Post-op Pain Management: Ofirmev IV (intra-op)* and Regional block*   Induction: Intravenous, Rapid sequence and Cricoid pressure planned  PONV Risk Score and Plan: 2 and Ondansetron, Dexamethasone and Midazolam  Airway Management Planned: Oral ETT  Additional Equipment:   Intra-op Plan:   Post-operative Plan: Extubation in OR  Informed Consent: I have reviewed the patients History and Physical, chart, labs and discussed the procedure including the risks, benefits and alternatives for the proposed anesthesia with the patient or authorized representative who has indicated his/her understanding and acceptance.     Dental advisory given  Plan Discussed with: Anesthesiologist, CRNA and Surgeon  Anesthesia Plan Comments:        Anesthesia Quick Evaluation

## 2022-10-28 NOTE — Progress Notes (Signed)
Orthopedic Tech Progress Note Patient Details:  Samuel Stark 1991-10-07 [id number]  Ortho Devices Type of Ortho Device: CAM walker Ortho Device/Splint Location: delivered to or Ortho Device/Splint Interventions: Renard Matter 10/28/2022, 11:08 PM

## 2022-10-28 NOTE — Discharge Instructions (Signed)
Orthopedic discharge instructions:  Okay to bear weight as tolerated through the left heel.  He should use assistive devices.  He should avoid forefoot weightbearing through the fracture sites.  Elevate the left lower extremity with your "toes above nose when able.  Use around-the-clock as often as possible to help reduce swelling and also to help manage pain.  You should also apply ice to the left foot throughout the day for 20 to 30 minutes each hour that you are awake.  Do not get your incisions wet until your follow-up appointment with Dr. Stann Mainland in 1 weeks for a wound check .Call [phone] to make an appointment.   For mild to moderate pain use Tylenol and Advil in alternating fashion around-the-clock.  For breakthrough pain use oxycodone as necessary.  For the reduction in risk of blood clots you should take an 81 mg aspirin once per day x6 weeks.

## 2022-10-28 NOTE — Anesthesia Procedure Notes (Signed)
Procedure Name: Intubation Date/Time: 10/28/2022 10:38 PM  Performed by: Clovis Cao, CRNAPre-anesthesia Checklist: Patient identified, Emergency Drugs available, Suction available and Patient being monitored Patient Re-evaluated:Patient Re-evaluated prior to induction Oxygen Delivery Method: Circle system utilized Preoxygenation: Pre-oxygenation with 100% oxygen Induction Type: IV induction Ventilation: Mask ventilation without difficulty LMA: LMA inserted LMA Size: 4.0 Tube type: Oral Number of attempts: 1 Placement Confirmation: positive ETCO2 and breath sounds checked- equal and bilateral Tube secured with: Tape Dental Injury: Teeth and Oropharynx as per pre-operative assessment

## 2022-10-29 ENCOUNTER — Observation Stay (HOSPITAL_COMMUNITY): Payer: Worker's Compensation

## 2022-10-29 ENCOUNTER — Encounter (HOSPITAL_COMMUNITY): Payer: Self-pay | Admitting: Orthopedic Surgery

## 2022-10-29 LAB — CBC
HCT: 39.1 % (ref 39.0–52.0)
Hemoglobin: 13.2 g/dL (ref 13.0–17.0)
MCH: 30.8 pg (ref 26.0–34.0)
MCHC: 33.8 g/dL (ref 30.0–36.0)
MCV: 91.4 fL (ref 80.0–100.0)
Platelets: 231 10*3/uL (ref 150–400)
RBC: 4.28 MIL/uL (ref 4.22–5.81)
RDW: 13.3 % (ref 11.5–15.5)
WBC: 8.1 10*3/uL (ref 4.0–10.5)
nRBC: 0 % (ref 0.0–0.2)

## 2022-10-29 LAB — CREATININE, SERUM
Creatinine, Ser: 1.08 mg/dL (ref 0.61–1.24)
GFR, Estimated: >60 mL/min (ref >60)

## 2022-10-29 MED ORDER — CEFAZOLIN SODIUM-DEXTROSE 2-4 GM/100ML-% IV SOLN: 2.0000 g | INTRAVENOUS | Status: DC

## 2022-10-29 MED ORDER — METOCLOPRAMIDE HCL 5 MG PO TABS: 5.0000 mg | ORAL_TABLET | Freq: Three times a day (TID) | ORAL | Status: DC | PRN

## 2022-10-29 MED ORDER — ENOXAPARIN SODIUM 40 MG/0.4ML IJ SOSY: 40.0000 mg | PREFILLED_SYRINGE | Freq: Every day | INTRAMUSCULAR | Status: DC

## 2022-10-29 MED ORDER — METOCLOPRAMIDE HCL 5 MG/ML IJ SOLN: 5.0000 mg | Freq: Three times a day (TID) | INTRAMUSCULAR | Status: DC | PRN

## 2022-10-29 MED ORDER — ONDANSETRON HCL 4 MG/2ML IJ SOLN: 4.0000 mg | Freq: Four times a day (QID) | INTRAMUSCULAR | Status: DC | PRN

## 2022-10-29 MED ORDER — HYDROCODONE-ACETAMINOPHEN 5-325 MG PO TABS: 1.0000 | ORAL_TABLET | ORAL | Status: DC | PRN

## 2022-10-29 MED ORDER — DOCUSATE SODIUM 100 MG PO CAPS
100.0000 mg | ORAL_CAPSULE | Freq: Two times a day (BID) | ORAL | Status: DC
  Administered 2022-10-29 (×2): 100 mg via ORAL
  Filled 2022-10-29 (×2): qty 1

## 2022-10-29 MED ORDER — HYDROCODONE-ACETAMINOPHEN 7.5-325 MG PO TABS: 1.0000 | ORAL_TABLET | ORAL | Status: DC | PRN

## 2022-10-29 MED ORDER — POVIDONE-IODINE 10 % EX SWAB: 2.0000 | Freq: Once | CUTANEOUS | Status: DC

## 2022-10-29 MED ORDER — ONDANSETRON HCL 4 MG PO TABS: 4.0000 mg | ORAL_TABLET | Freq: Four times a day (QID) | ORAL | Status: DC | PRN

## 2022-10-29 MED ORDER — CHLORHEXIDINE GLUCONATE 4 % EX LIQD
60.0000 mL | Freq: Once | CUTANEOUS | Status: DC
  Filled 2022-10-29: qty 60

## 2022-10-29 MED ORDER — CEFAZOLIN SODIUM-DEXTROSE 2-4 GM/100ML-% IV SOLN
2.0000 g | Freq: Four times a day (QID) | INTRAVENOUS | Status: AC
  Administered 2022-10-29 (×2): 2 g via INTRAVENOUS
  Filled 2022-10-29 (×2): qty 100

## 2022-10-29 MED ORDER — ACETAMINOPHEN 325 MG PO TABS: 325.0000 mg | ORAL_TABLET | Freq: Four times a day (QID) | ORAL | Status: DC | PRN

## 2022-10-29 MED ORDER — MORPHINE SULFATE (PF) 2 MG/ML IV SOLN: 0.5000 mg | INTRAVENOUS | Status: DC | PRN

## 2022-10-29 MED ORDER — ACETAMINOPHEN 500 MG PO TABS
500.0000 mg | ORAL_TABLET | Freq: Four times a day (QID) | ORAL | Status: DC
  Administered 2022-10-29 (×2): 500 mg via ORAL
  Filled 2022-10-29 (×2): qty 1

## 2022-10-29 NOTE — Progress Notes (Signed)
PT Cancellation Note  Patient Details Name: Samuel Stark MRN: [id number] DOB: 06-01-1991   Cancelled Treatment:    Reason Eval/Treat Not Completed: Medical issues which prohibited therapy.  Pt has blood outside sx bandage, will reattempt after nsg looks at it.   Ramond Dial 10/29/2022, 10:20 AM  Mee Hives, PT PhD Acute Rehab Dept. Number: [place] and [place]

## 2022-10-29 NOTE — TOC Initial Note (Addendum)
Transition of Care [facility]) - Initial/Assessment Note    Patient Details  Name: Samuel Stark MRN: [id number] Date of Birth: 1991/03/08  [facility]) CM/SW Contact:    Kingsley Plan, RN Phone Number: 10/29/2022, 12:45 PM  Clinical Narrative:                  Spoke to patient and wife at bedside.   Patient injured at work.   Patient does not have workers comp information.   Patient works for [employer] [phone] . Wife called same and placed on speaker phone.   Denisa , Regional Theatre manager with [employer]  is on her way to visit patient . Report has not been filed until [employer] speaks with patient.   PT recommending walker and 3 in 1. Patient wants walker but not 3 in 1 .   Denisa at bedside stated [name] [phone] will provide payment information for walker. NCM called same and confirmed. Nash Dimmer with [employer] [facility]ump contact information. Lacresia called x 2 no answer . Denisa provided second contact Vanessa Barbara [phone] , information given to Bo Merino requesting information clinicals etc be faxed to [facility] [phone] provided patient with release of information form . Patient signed release of information. Information faxed.  Wife requesting walker to be delivered to home. Spoke to [place] it will be drop shipped and take 2 to 3 days . Wife aware and still wants it sent to home    Ann Maki with [employer] for walker  Expected Discharge Plan: Home/Self Care Barriers to Discharge: Continued Medical Work up   Patient Goals and CMS Choice Patient states their goals for this hospitalization and ongoing recovery are:: to return to home CMS Medicare.gov Compare Post Acute Care list provided to:: Patient Choice offered to / list presented to : Patient  Expected Discharge Plan and Services Expected Discharge Plan: Home/Self Care   Discharge Planning Services: CM Consult Post Acute Care Choice: Durable Medical  Equipment Living arrangements for the past 2 months: Single Family Home Expected Discharge Date: 10/29/22               DME Arranged: Dan Humphreys rolling DME Agency: [employer] Date DME Agency Contacted: 10/29/22 Time DME Agency Contacted: [phone] Representative spoke with at DME Agency: Beola Cord HH Arranged: NA          Prior Living Arrangements/Services Living arrangements for the past 2 months: Single Family Home Lives with:: Spouse Patient language and need for interpreter reviewed:: Yes Do you feel safe going back to the place where you live?: Yes      Need for Family Participation in Patient Care: Yes (Comment) Care giver support system in place?: Yes (comment)   Criminal Activity/Legal Involvement Pertinent to Current Situation/Hospitalization: No - Comment as needed  Activities of Daily Living Home Assistive Devices/Equipment: None ADL Screening (condition at time of admission) Patient's cognitive ability adequate to safely complete daily activities?: Yes Is the patient deaf or have difficulty hearing?: No Does the patient have difficulty seeing, even when wearing glasses/contacts?: No Does the patient have difficulty concentrating, remembering, or making decisions?: No Patient able to express need for assistance with ADLs?: Yes Does the patient have difficulty dressing or bathing?: No Independently performs ADLs?: Yes (appropriate for developmental age) Does the patient have difficulty walking or climbing stairs?: No Weakness of Legs: None Weakness of Arms/Hands: None  Permission Sought/Granted   Permission granted to share  information with : Yes, Verbal Permission Granted  Share Information with NAME: wife Samuel Stark           Emotional Assessment Appearance:: Appears stated age Attitude/Demeanor/Rapport: Engaged Affect (typically observed): Accepting Orientation: : Oriented to Self, Oriented to  Time, Oriented to Place, Oriented to Situation Alcohol / Substance Use:  Not Applicable Psych Involvement: No (comment)  Admission diagnosis:  Open fracture of left foot, initial encounter [S92.902B] Status post surgery [Z98.890] Displaced fracture of third metatarsal bone, left foot, initial encounter for open fracture [S92.332B] Patient Active Problem List   Diagnosis Date Noted   Status post surgery 10/28/2022   Displaced fracture of third metatarsal bone, left foot, initial encounter for open fracture 10/28/2022   Vomiting 12/01/2013   Lower abdominal pain 12/01/2013   PCP:  Pcp, No Pharmacy:   [facility] [place] [address] [place] [address] Phone: [phone] Fax: [phone]     Social Determinants of Health (SDOH) Interventions    Readmission Risk Interventions     No data to display

## 2022-10-29 NOTE — Progress Notes (Signed)
Physical Therapy Evaluation Patient Details Name: Samuel Stark MRN: [id number] DOB: 1991/09/19 Today's Date: 10/29/2022  History of Present Illness  31 yo male with injury to L foot at work when he was run over by forklift was seen for L foot.  Has commin. and displaced acute fractures of the second, third,  and fourth metatarsal shafts, nondisplaced acute fracture of the distal shaft of the middle  phalanx of the second toe.  WBAT on L heel in cam boot.  Clinical Impression  Pt is up to side of bed and walked in room initially, deciding with PT that he prefers the RW.  Pt was instructed on correct technique to fit the cam boot, and his significant other was as well.  Pt practiced sequence of stairs.  Home education for use of cam and walker is done, and pt and his sign other had all questions answered.  Pt is likely going home today but have scheduled another PT visit for review of use of walker and boot as needed tomorrow and will not recommend him to have PT follow up for now.       Recommendations for follow up therapy are one component of a multi-disciplinary discharge planning process, led by the attending physician.  Recommendations may be updated based on patient status, additional functional criteria and insurance authorization.  Follow Up Recommendations No PT follow up      Assistance Recommended at Discharge Set up Supervision/Assistance  Patient can return home with the following  A little help with walking and/or transfers;Assistance with cooking/housework;Assist for transportation    Equipment Recommendations Rolling walker (2 wheels)  Recommendations for Other Services       Functional Status Assessment Patient has had a recent decline in their functional status and demonstrates the ability to make significant improvements in function in a reasonable and predictable amount of time.     Precautions / Restrictions Precautions Precautions: Fall Precaution Comments: cam  boot when on LLE Required Braces or Orthoses: Other Brace Other Brace: cam boot with heel WB only Restrictions Weight Bearing Restrictions: Yes LLE Weight Bearing: Weight bearing as tolerated Other Position/Activity Restrictions: through heel only      Mobility  Bed Mobility Overal bed mobility: Modified Independent                  Transfers Overall transfer level: Needs assistance Equipment used: None Transfers: Sit to/from Stand Sit to Stand: Supervision           General transfer comment: supervision for lines    Ambulation/Gait Ambulation/Gait assistance: Supervision, Min guard Gait Distance (Feet): 140 Feet Assistive device: Rolling walker (2 wheels) Gait Pattern/deviations: Step-through pattern, Step-to pattern, Decreased stride length, Decreased weight shift to left Gait velocity: reduced Gait velocity interpretation: <1.31 ft/sec, indicative of household ambulator   General Gait Details: heel wb on cam boot LLE  Stairs Stairs: Yes Stairs assistance: Min guard Stair Management: Two rails, Step to pattern, Forwards, Backwards Number of Stairs: 6 General stair comments: practiced leading with RLE up and LLE down  Wheelchair Mobility    Modified Rankin (Stroke Patients Only)       Balance Overall balance assessment: Needs assistance Sitting-balance support: Feet supported Sitting balance-Leahy Scale: Good     Standing balance support: Bilateral upper extremity supported, During functional activity Standing balance-Leahy Scale: Fair Standing balance comment: fair but fair- dynamically  Pertinent Vitals/Pain Pain Assessment Pain Assessment: 0-10 Pain Score: 6  Pain Location: L foot Pain Descriptors / Indicators: Operative site guarding Pain Intervention(s): Limited activity within patient's tolerance, Monitored during session, Premedicated before session, Repositioned    Home Living Family/patient  expects to be discharged to:: Private residence Living Arrangements: Spouse/significant other Available Help at Discharge: Family;Available 24 hours/day Type of Home: House Home Access: Stairs to enter Entrance Stairs-Rails: Can reach both Entrance Stairs-Number of Steps: 6   Home Layout: One level Home Equipment: None Additional Comments: has been driving and working    Prior Function Prior Level of Function : Independent/Modified Independent             Mobility Comments: I gait with no AD       Hand Dominance   Dominant Hand: Right    Extremity/Trunk Assessment   Upper Extremity Assessment Upper Extremity Assessment: Overall WFL for tasks assessed    Lower Extremity Assessment Lower Extremity Assessment: LLE deficits/detail LLE Deficits / Details: L foot fractures on 4 toes LLE Coordination: decreased gross motor    Cervical / Trunk Assessment Cervical / Trunk Assessment: Normal  Communication   Communication: No difficulties  Cognition Arousal/Alertness: Awake/alert Behavior During Therapy: WFL for tasks assessed/performed Overall Cognitive Status: Within Functional Limits for tasks assessed                                          General Comments General comments (skin integrity, edema, etc.): Pt is up to walk with RW but initially wanted to walk with no AD.  Gave RW as it allows him to [facility] with more control on LLE, as well as less LOB    Exercises     Assessment/Plan    PT Assessment Patient needs continued PT services  PT Problem List Decreased strength;Decreased range of motion;Decreased activity tolerance;Decreased balance;Decreased coordination;Decreased knowledge of use of DME;Decreased safety awareness;Pain;Decreased skin integrity       PT Treatment Interventions DME instruction;Gait training;Stair training;Functional mobility training;Therapeutic activities;Therapeutic exercise;Balance training;Neuromuscular  re-education;Patient/family education    PT Goals (Current goals can be found in the Care Plan section)  Acute Rehab PT Goals Patient Stated Goal: to get home today if possible PT Goal Formulation: With patient/family Time For Goal Achievement: 11/05/22 Potential to Achieve Goals: Good    Frequency Min 5X/week     Co-evaluation               AM-PAC PT "6 Clicks" Mobility  Outcome Measure Help needed turning from your back to your side while in a flat bed without using bedrails?: None Help needed moving from lying on your back to sitting on the side of a flat bed without using bedrails?: None Help needed moving to and from a bed to a chair (including a wheelchair)?: A Little Help needed standing up from a chair using your arms (e.g., wheelchair or bedside chair)?: A Little Help needed to walk in hospital room?: A Little Help needed climbing 3-5 steps with a railing? : A Little 6 Click Score: 20    End of Session Equipment Utilized During Treatment: Gait belt Activity Tolerance: Patient tolerated treatment well;Patient limited by fatigue Patient left: in bed;with call bell/phone within reach;with family/visitor present;with bed alarm set Nurse Communication: Mobility status PT Visit Diagnosis: Unsteadiness on feet (R26.81);Difficulty in walking, not elsewhere classified (R26.2);Pain Pain - Right/Left: Left Pain - part of  body: Ankle and joints of foot    Time: 1017-1103 PT Time Calculation (min) (ACUTE ONLY): 46 min   Charges:   PT Evaluation $PT Eval Moderate Complexity: 1 Mod PT Treatments $Gait Training: 8-22 mins $Therapeutic Activity: 8-22 mins       Ivar Drape 10/29/2022, 1:55 PM  Samul Dada, PT PhD Acute Rehab Dept. Number: [facility] [id number] and [facility] [phone]

## 2022-10-29 NOTE — Anesthesia Postprocedure Evaluation (Signed)
Anesthesia Post Note  Patient: Samuel Stark  Procedure(s) Performed: IRRIGATION AND DEBRIDEMENT LEFT FOOT AND WOUND CLOSURE (Left: Foot)     Patient location during evaluation: PACU Anesthesia Type: General Level of consciousness: sedated Pain management: pain level controlled Vital Signs Assessment: post-procedure vital signs reviewed and stable Respiratory status: spontaneous breathing and respiratory function stable Cardiovascular status: stable Postop Assessment: no apparent nausea or vomiting Anesthetic complications: no   No notable events documented.  Last Vitals:  Vitals:   10/28/22 2340 10/28/22 2355  BP: (!) 129/92 (!) 121/92  Pulse: 68 71  Resp: 13 12  Temp:  36.6 C  SpO2: 96% 98%    Last Pain:  Vitals:   10/28/22 2355  TempSrc:   PainSc: 0-No pain                 Brogan Martis DANIEL

## 2022-10-29 NOTE — Discharge Summary (Cosign Needed Addendum)
Patient ID: Samuel Stark MRN: [id number] DOB/AGE: [birth year] 31 y.o.  Admit date: 10/28/2022 Discharge date: 10/29/22  Primary Diagnosis: Open fracture of the left second, third, fourth metatarsal, left foot traumatic wound.  Admission Diagnoses: Status post irrigation and debridement of open fracture of the left second, third, fourth metatarsal fractures History reviewed. No pertinent past medical history. Discharge Diagnoses:   Principal Problem:   Status post surgery Active Problems:   Displaced fracture of third metatarsal bone, left foot, initial encounter for open fracture  Estimated body mass index is 21.76 kg/m as calculated from the following:   Height as of this encounter: 5\' 11"  (1.803 m).   Weight as of this encounter: 70.8 kg.  Procedure:  Procedure(s) (LRB): IRRIGATION AND DEBRIDEMENT LEFT FOOT AND WOUND CLOSURE (Left)   Consults: None  HPI: Samuel Stark is a 31 year old male who presented to the ER after a left foot trauma sustained at work on 10/28/2022 at roughly 5:30 PM.  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  He had immediate pain swelling and bleeding.  In the ER he was noted to have multiple metatarsal fractures and some phalangeal fractures with open wounds.  He was brought to the operating room on 10/28/2022 for irrigation debridement of his open fractures.  The wounds were closed and he was dressed in later placed in a postoperative fracture boot for stabilization.  He was admitted overnight for monitoring and IV antibiotics for open fracture.  Laboratory Data: Admission on 10/28/2022  Component Date Value Ref Range Status   WBC 10/28/2022 7.5  4.0 - 10.5 K/uL Final   RBC 10/28/2022 4.54  4.22 - 5.81 MIL/uL Final   Hemoglobin 10/28/2022 14.0  13.0 - 17.0 g/dL Final   HCT 10/28/2022 41.8  39.0 - 52.0 % Final   MCV 10/28/2022 92.1  80.0 - 100.0 fL Final   MCH  10/28/2022 30.8  26.0 - 34.0 pg Final   MCHC 10/28/2022 33.5  30.0 - 36.0 g/dL Final   RDW 10/28/2022 13.2  11.5 - 15.5 % Final   Platelets 10/28/2022 256  150 - 400 K/uL Final   nRBC 10/28/2022 0.0  0.0 - 0.2 % Final   Neutrophils Relative % 10/28/2022 71  % Final   Neutro Abs 10/28/2022 5.3  1.7 - 7.7 K/uL Final   Lymphocytes Relative 10/28/2022 19  % Final   Lymphs Abs 10/28/2022 1.5  0.7 - 4.0 K/uL Final   Monocytes Relative 10/28/2022 9  % Final   Monocytes Absolute 10/28/2022 0.7  0.1 - 1.0 K/uL Final   Eosinophils Relative 10/28/2022 1  % Final   Eosinophils Absolute 10/28/2022 0.1  0.0 - 0.5 K/uL Final   Basophils Relative 10/28/2022 0  % Final   Basophils Absolute 10/28/2022 0.0  0.0 - 0.1 K/uL Final   Immature Granulocytes 10/28/2022 0  % Final   Abs Immature Granulocytes 10/28/2022 0.03  0.00 - 0.07 K/uL Final   Performed at [facility], [place] [address], [place], [place] [address]   Sodium 10/28/2022 138  135 - 145 mmol/L Final   Potassium 10/28/2022 4.7  3.5 - 5.1 mmol/L Final   HEMOLYSIS AT THIS LEVEL MAY AFFECT RESULT   Chloride 10/28/2022 101  98 - 111 mmol/L Final   CO2 10/28/2022 22  22 - 32 mmol/L Final   Glucose, Bld 10/28/2022 107 (H)  70 - 99 mg/dL Final  Glucose reference range applies only to samples taken after fasting for at least 8 hours.   BUN 10/28/2022 8  6 - 20 mg/dL Final   Creatinine, Ser 10/28/2022 1.20  0.61 - 1.24 mg/dL Final   Calcium 10/28/2022 9.1  8.9 - 10.3 mg/dL Final   Total Protein 10/28/2022 6.3 (L)  6.5 - 8.1 g/dL Final   Albumin 10/28/2022 3.2 (L)  3.5 - 5.0 g/dL Final   AST 10/28/2022 58 (H)  15 - 41 U/L Final   HEMOLYSIS AT THIS LEVEL MAY AFFECT RESULT   ALT 10/28/2022 28  0 - 44 U/L Final   HEMOLYSIS AT THIS LEVEL MAY AFFECT RESULT   Alkaline Phosphatase 10/28/2022 66  38 - 126 U/L Final   Total Bilirubin 10/28/2022 0.7  0.3 - 1.2 mg/dL Final   HEMOLYSIS AT THIS LEVEL MAY AFFECT RESULT   GFR, Estimated 10/28/2022 >60  >60  mL/min Final   Comment: (NOTE) Calculated using the CKD-EPI Creatinine Equation (2021)    Anion gap 10/28/2022 15  5 - 15 Final   Performed at [facility], [place] [address], [place], [place] [address]   WBC 10/29/2022 8.1  4.0 - 10.5 K/uL Final   RBC 10/29/2022 4.28  4.22 - 5.81 MIL/uL Final   Hemoglobin 10/29/2022 13.2  13.0 - 17.0 g/dL Final   HCT 10/29/2022 39.1  39.0 - 52.0 % Final   MCV 10/29/2022 91.4  80.0 - 100.0 fL Final   MCH 10/29/2022 30.8  26.0 - 34.0 pg Final   MCHC 10/29/2022 33.8  30.0 - 36.0 g/dL Final   RDW 10/29/2022 13.3  11.5 - 15.5 % Final   Platelets 10/29/2022 231  150 - 400 K/uL Final   nRBC 10/29/2022 0.0  0.0 - 0.2 % Final   Performed at [facility] [address], [place], [place] [address]   Creatinine, Ser 10/29/2022 1.08  0.61 - 1.24 mg/dL Final   GFR, Estimated 10/29/2022 >60  >60 mL/min Final   Comment: (NOTE) Calculated using the CKD-EPI Creatinine Equation (2021) Performed at [facility], [place] [address], [place], [place] [address]      X-Rays:DG Foot Complete Left  Result Date: 10/29/2022 CLINICAL DATA:  Closest placed fracture third metatarsal bone of left foot. Injury. EXAM: LEFT FOOT - COMPLETE 3+ VIEW COMPARISON:  Left foot radiographs 10/28/2021 FINDINGS: Redemonstration of comminuted and displaced fractures of the mediastinal second metatarsal shaft and distal third metatarsal shaft, not significantly changed. More mildly comminuted and only minimally displaced fracture of the proximal shaft of the fourth metatarsal. No definite intra-articular extension. Curvilinear lucency again suggesting a nondisplaced acute fracture of the distal shaft of the middle phalanx of the second toe. This fracture line may minimally contact the distal lateral articular surface of the middle phalanx. Mild dorsal midfoot soft tissue swelling. IMPRESSION: 1. Comminuted and displaced acute fractures of the second, third, and fourth metatarsal shafts,  not significantly changed. 2. Nondisplaced acute fracture of the distal shaft of the middle phalanx of the second toe, not significantly changed. Electronically Signed   By: Yvonne Kendall M.D.   On: 10/29/2022 09:25   DG Foot 2 Views Left  Result Date: 10/28/2022 CLINICAL DATA:  Question injury to the left foot. EXAM: LEFT FOOT - 2 VIEW COMPARISON:  None Available. FINDINGS: Comminuted and displaced fracture of the second and third distal metatarsals. There is a mildly displaced and angulated fracture of the proximal fourth metatarsal. Mildly comminuted fracture of  the base of the fifth metatarsal. There is no convincing intra-articular involvement of these fractures by radiograph. Suspected fracture of the second toe middle phalanx. No visible tarsal bone fracture. Soft tissue air and edema overlies the dorsum of the foot. A dressing is in place. IMPRESSION: 1. Comminuted and displaced fractures of the second and third distal metatarsals. 2. Mildly displaced and angulated fracture of the proximal fourth metatarsal. 3. Mildly comminuted and displaced fracture of the base of the fifth metatarsal. 4. Suspect fracture of the second toe middle phalanx. Electronically Signed   By: Keith Rake M.D.   On: 10/28/2022 20:47    EKG:No orders found for this or any previous visit.    Physical exam: Constitutional: General Appearance: healthy-appearing, well-nourished, and well-developed. In hospital bed.  Level of Distress: no acute distress. Eyes: Lens (normal) clear: both eyes. Head: Head: normocephalic and atraumatic. Lungs: Respiratory effort: no dyspnea. Skin: Inspection and palpation: no rash, lesions Neurologic: Cranial Nerves: grossly intact. Sensation: grossly intact. Psychiatric: Insight: good judgement and insight. Mental Status: normal mood and affect and active and alert. Orientation: to time, place, and person.   Left Lower Extremity:  Fracture boot present with small amount of bloody  drainage noted on his dressing with Kerlix, Xeroform.  Unable to wiggle toes secondary to active block.  Decreased sensation secondary to active block.  Normal capillary refill.  No calf pain.   Hospital Course: Samuel Stark is a 31 y.o. who was admitted to Hospital. They were brought to the operating room on 10/28/2022 and underwent Procedure(s): IRRIGATION AND DEBRIDEMENT LEFT FOOT AND WOUND CLOSURE.  Patient tolerated the procedure well and was later transferred to the recovery room and then to the orthopaedic floor for postoperative care.  They were given PO and IV analgesics for pain control following their surgery.  They were given 24 hours of postoperative antibiotics of  Anti-infectives (From admission, onward)    Start     Dose/Rate Route Frequency Ordered Stop   10/29/22 0600  ceFAZolin (ANCEF) IVPB 2g/100 mL premix        2 g 200 mL/hr over 30 Minutes Intravenous On call to O.R. 10/29/22 0025 10/30/22 0559   10/29/22 0027  ceFAZolin (ANCEF) IVPB 2g/100 mL premix        2 g 200 mL/hr over 30 Minutes Intravenous Every 6 hours 10/29/22 0027 10/29/22 0723   10/28/22 1945  ceFAZolin (ANCEF) IVPB 2g/100 mL premix        2 g 200 mL/hr over 30 Minutes Intravenous  Once 10/28/22 1935 10/28/22 2126      and started on DVT prophylaxis in the form of Lovenox.   Marland Kitchen  Discharge planning consulted to help with postop disposition and equipment needs.  Patient had an uneventful night on the evening of surgery.  Patient was having minimal pain secondary to still having an active popliteal block.  Very mild bloody drainage on his dressing.  Was feeling well.   Patient was seen in rounds and was ready to go home once DME delivered.   We did discuss with the patient that we will review his imaging with a foot and ankle specialist to determine if any further surgical intervention would be required or if he can continue with conservative treatment of his fractures.   Representative from the patient's  work visited him in the hospital noted that the company requested a drug test in the setting of a workplace accident.  I did discuss with the employers representative that  I would not be ordering a urine drug test in the setting as patient had surgery last night and had opioids administered.  The test would be an inaccurate due to this.   Diet: Regular diet Activity: Heel touch weightbearing LLE   Follow-up:in 1 weeks Disposition - Home Discharged Condition: good   Discharge Instructions     Call MD / Call 911   Complete by: As directed    If you experience chest pain or shortness of breath, CALL 911 and be transported to the hospital emergency room.  If you develope a fever above 101 F, pus (white drainage) or increased drainage or redness at the wound, or calf pain, call your surgeon's office.   Constipation Prevention   Complete by: As directed    Drink plenty of fluids.  Prune juice may be helpful.  You may use a stool softener, such as Colace (over the counter) 100 mg twice a day.  Use MiraLax (over the counter) for constipation as needed.   Diet - low sodium heart healthy   Complete by: As directed    Increase activity slowly as tolerated   Complete by: As directed    Post-operative opioid taper instructions:   Complete by: As directed    POST-OPERATIVE OPIOID TAPER INSTRUCTIONS: It is important to wean off of your opioid medication as soon as possible. If you do not need pain medication after your surgery it is ok to stop day one. Opioids include: Codeine, Hydrocodone(Norco, Vicodin), Oxycodone(Percocet, oxycontin) and hydromorphone amongst others.  Long term and even short term use of opiods can cause: Increased pain response Dependence Constipation Depression Respiratory depression And more.  Withdrawal symptoms can include Flu like symptoms Nausea, vomiting And more Techniques to manage these symptoms Hydrate well Eat regular healthy meals Stay active Use  relaxation techniques(deep breathing, meditating, yoga) Do Not substitute Alcohol to help with tapering If you have been on opioids for less than two weeks and do not have pain than it is ok to stop all together.  Plan to wean off of opioids This plan should start within one week post op of your joint replacement. Maintain the same interval or time between taking each dose and first decrease the dose.  Cut the total daily intake of opioids by one tablet each day Next start to increase the time between doses. The last dose that should be eliminated is the evening dose.         Allergies as of 10/29/2022       Reactions   Benadryl [diphenhydramine Hcl] Hives        Medication List     TAKE these medications    ondansetron 4 MG disintegrating tablet Commonly known as: Zofran ODT 4mg  ODT q4 hours prn nausea/vomit   oxyCODONE 5 MG immediate release tablet Commonly known as: Roxicodone Take 1 tablet (5 mg total) by mouth every 6 (six) hours as needed for severe pain.               Durable Medical Equipment  (From admission, onward)           Start     Ordered   10/29/22 1250  For home use only DME Walker rolling  Once       Question Answer Comment  Walker: With 5 Inch Wheels   Patient needs a walker to treat with the following condition Weakness      10/29/22 1249  Follow-up Information     Nicholes Stairs, MD Follow up in 2 week(s).   Specialty: Orthopedic Surgery Why: For suture removal, For wound re-check Contact information: [address] [place] [address] [place] [address] [phone]                 Signed: Jonelle Sidle PA-C  Orthopaedic Surgery 10/29/2022, 12:57 PM

## 2023-08-21 ENCOUNTER — Ambulatory Visit: Payer: Worker's Compensation | Admitting: Podiatry

## 2023-08-28 ENCOUNTER — Ambulatory Visit: Payer: Worker's Compensation | Admitting: Podiatry

## 2023-08-28 DIAGNOSIS — M79673 Pain in unspecified foot: Secondary | ICD-10-CM
